# Patient Record
Sex: Male | Born: 1970 | Race: White | Hispanic: No | Smoking: Current every day smoker
Health system: Southern US, Community
[De-identification: ages and names within clinical notes are randomized; demographics above are authoritative.]

## PROBLEM LIST (undated history)

## (undated) DIAGNOSIS — I219 Acute myocardial infarction, unspecified: Secondary | ICD-10-CM

## (undated) DIAGNOSIS — K922 Gastrointestinal hemorrhage, unspecified: Secondary | ICD-10-CM

## (undated) DIAGNOSIS — N200 Calculus of kidney: Secondary | ICD-10-CM

## (undated) DIAGNOSIS — I2510 Atherosclerotic heart disease of native coronary artery without angina pectoris: Secondary | ICD-10-CM

## (undated) DIAGNOSIS — I10 Essential (primary) hypertension: Secondary | ICD-10-CM

## (undated) DIAGNOSIS — F1020 Alcohol dependence, uncomplicated: Secondary | ICD-10-CM

## (undated) DIAGNOSIS — E785 Hyperlipidemia, unspecified: Secondary | ICD-10-CM

## (undated) HISTORY — DX: Atherosclerotic heart disease of native coronary artery without angina pectoris: I25.10

## (undated) HISTORY — PX: OTHER SURGICAL HISTORY: SHX169

## (undated) HISTORY — PX: NEPHRECTOMY: SHX65

---

## 2014-02-13 ENCOUNTER — Other Ambulatory Visit: Payer: Self-pay | Admitting: Internal Medicine

## 2014-02-13 DIAGNOSIS — R413 Other amnesia: Secondary | ICD-10-CM

## 2014-02-13 DIAGNOSIS — I10 Essential (primary) hypertension: Secondary | ICD-10-CM

## 2014-05-01 ENCOUNTER — Emergency Department (HOSPITAL_COMMUNITY): Payer: Self-pay

## 2014-05-01 ENCOUNTER — Encounter (HOSPITAL_COMMUNITY): Payer: Self-pay | Admitting: *Deleted

## 2014-05-01 ENCOUNTER — Inpatient Hospital Stay (HOSPITAL_COMMUNITY)
Admission: EM | Admit: 2014-05-01 | Discharge: 2014-05-03 | DRG: 897 | Disposition: A | Discharge status: Home / Self Care | Payer: Self-pay | Attending: Internal Medicine | Admitting: Internal Medicine

## 2014-05-01 ENCOUNTER — Observation Stay (HOSPITAL_COMMUNITY): Payer: MEDICAID

## 2014-05-01 DIAGNOSIS — R079 Chest pain, unspecified: Secondary | ICD-10-CM

## 2014-05-01 DIAGNOSIS — R109 Unspecified abdominal pain: Secondary | ICD-10-CM

## 2014-05-01 DIAGNOSIS — R072 Precordial pain: Secondary | ICD-10-CM

## 2014-05-01 DIAGNOSIS — R945 Abnormal results of liver function studies: Secondary | ICD-10-CM

## 2014-05-01 DIAGNOSIS — Z8711 Personal history of peptic ulcer disease: Secondary | ICD-10-CM

## 2014-05-01 DIAGNOSIS — Z955 Presence of coronary angioplasty implant and graft: Secondary | ICD-10-CM

## 2014-05-01 DIAGNOSIS — F10239 Alcohol dependence with withdrawal, unspecified: Secondary | ICD-10-CM | POA: Diagnosis present

## 2014-05-01 DIAGNOSIS — F10231 Alcohol dependence with withdrawal delirium: Principal | ICD-10-CM | POA: Diagnosis present

## 2014-05-01 DIAGNOSIS — Z8249 Family history of ischemic heart disease and other diseases of the circulatory system: Secondary | ICD-10-CM

## 2014-05-01 DIAGNOSIS — I209 Angina pectoris, unspecified: Secondary | ICD-10-CM

## 2014-05-01 DIAGNOSIS — I959 Hypotension, unspecified: Secondary | ICD-10-CM | POA: Diagnosis present

## 2014-05-01 DIAGNOSIS — I10 Essential (primary) hypertension: Secondary | ICD-10-CM | POA: Diagnosis present

## 2014-05-01 DIAGNOSIS — F10939 Alcohol use, unspecified with withdrawal, unspecified: Secondary | ICD-10-CM | POA: Diagnosis present

## 2014-05-01 DIAGNOSIS — Z7982 Long term (current) use of aspirin: Secondary | ICD-10-CM

## 2014-05-01 DIAGNOSIS — I255 Ischemic cardiomyopathy: Secondary | ICD-10-CM | POA: Diagnosis present

## 2014-05-01 DIAGNOSIS — K760 Fatty (change of) liver, not elsewhere classified: Secondary | ICD-10-CM | POA: Diagnosis present

## 2014-05-01 DIAGNOSIS — R7989 Other specified abnormal findings of blood chemistry: Secondary | ICD-10-CM

## 2014-05-01 DIAGNOSIS — I2510 Atherosclerotic heart disease of native coronary artery without angina pectoris: Secondary | ICD-10-CM | POA: Diagnosis present

## 2014-05-01 DIAGNOSIS — D539 Nutritional anemia, unspecified: Secondary | ICD-10-CM | POA: Diagnosis present

## 2014-05-01 DIAGNOSIS — F10251 Alcohol dependence with alcohol-induced psychotic disorder with hallucinations: Secondary | ICD-10-CM | POA: Diagnosis present

## 2014-05-01 DIAGNOSIS — R0789 Other chest pain: Secondary | ICD-10-CM

## 2014-05-01 DIAGNOSIS — F17210 Nicotine dependence, cigarettes, uncomplicated: Secondary | ICD-10-CM | POA: Diagnosis present

## 2014-05-01 DIAGNOSIS — Z905 Acquired absence of kidney: Secondary | ICD-10-CM | POA: Diagnosis present

## 2014-05-01 DIAGNOSIS — F1020 Alcohol dependence, uncomplicated: Secondary | ICD-10-CM

## 2014-05-01 DIAGNOSIS — I252 Old myocardial infarction: Secondary | ICD-10-CM

## 2014-05-01 DIAGNOSIS — N19 Unspecified kidney failure: Secondary | ICD-10-CM | POA: Diagnosis present

## 2014-05-01 DIAGNOSIS — E785 Hyperlipidemia, unspecified: Secondary | ICD-10-CM | POA: Diagnosis present

## 2014-05-01 DIAGNOSIS — I369 Nonrheumatic tricuspid valve disorder, unspecified: Secondary | ICD-10-CM

## 2014-05-01 DIAGNOSIS — D696 Thrombocytopenia, unspecified: Secondary | ICD-10-CM | POA: Diagnosis present

## 2014-05-01 HISTORY — DX: Calculus of kidney: N20.0

## 2014-05-01 HISTORY — DX: Acute myocardial infarction, unspecified: I21.9

## 2014-05-01 HISTORY — DX: Atherosclerotic heart disease of native coronary artery without angina pectoris: I25.10

## 2014-05-01 HISTORY — DX: Hyperlipidemia, unspecified: E78.5

## 2014-05-01 HISTORY — DX: Gastrointestinal hemorrhage, unspecified: K92.2

## 2014-05-01 HISTORY — DX: Alcohol dependence, uncomplicated: F10.20

## 2014-05-01 HISTORY — DX: Essential (primary) hypertension: I10

## 2014-05-01 LAB — CBC
HCT: 37.2 % — ABNORMAL LOW (ref 39.0–52.0)
HEMATOCRIT: 37.7 % — AB (ref 39.0–52.0)
HEMOGLOBIN: 12.7 g/dL — AB (ref 13.0–17.0)
Hemoglobin: 12.7 g/dL — ABNORMAL LOW (ref 13.0–17.0)
MCH: 35 pg — ABNORMAL HIGH (ref 26.0–34.0)
MCH: 34.6 pg — AB (ref 26.0–34.0)
MCHC: 34.1 g/dL (ref 30.0–36.0)
MCHC: 33.7 g/dL (ref 30.0–36.0)
MCV: 102.5 fL — ABNORMAL HIGH (ref 78.0–100.0)
MCV: 102.7 fL — AB (ref 78.0–100.0)
PLATELETS: 119 10*3/uL — AB (ref 150–400)
Platelets: 120 10*3/uL — ABNORMAL LOW (ref 150–400)
RBC: 3.63 MIL/uL — AB (ref 4.22–5.81)
RBC: 3.67 MIL/uL — ABNORMAL LOW (ref 4.22–5.81)
RDW: 13.7 % (ref 11.5–15.5)
RDW: 13.9 % (ref 11.5–15.5)
WBC: 6.7 10*3/uL (ref 4.0–10.5)
WBC: 8 10*3/uL (ref 4.0–10.5)

## 2014-05-01 LAB — URINALYSIS, ROUTINE W REFLEX MICROSCOPIC
Bilirubin Urine: NEGATIVE
Glucose, UA: NEGATIVE mg/dL
Ketones, ur: NEGATIVE mg/dL
Leukocytes, UA: NEGATIVE
Nitrite: NEGATIVE
PH: 5.5 (ref 5.0–8.0)
Protein, ur: NEGATIVE mg/dL
Specific Gravity, Urine: 1.012 (ref 1.005–1.030)
UROBILINOGEN UA: 0.2 mg/dL (ref 0.0–1.0)

## 2014-05-01 LAB — HEPATIC FUNCTION PANEL
ALK PHOS: 67 U/L (ref 39–117)
ALT: 102 U/L — AB (ref 0–53)
AST: 105 U/L — AB (ref 0–37)
Albumin: 3.8 g/dL (ref 3.5–5.2)
Bilirubin, Direct: <0.2 mg/dL (ref 0.0–0.3)
TOTAL PROTEIN: 7.2 g/dL (ref 6.0–8.3)
Total Bilirubin: 0.6 mg/dL (ref 0.3–1.2)

## 2014-05-01 LAB — RAPID URINE DRUG SCREEN, HOSP PERFORMED
Amphetamines: NOT DETECTED
BENZODIAZEPINES: NOT DETECTED
Barbiturates: NOT DETECTED
Cocaine: NOT DETECTED
OPIATES: NOT DETECTED
Tetrahydrocannabinol: NOT DETECTED

## 2014-05-01 LAB — URINE MICROSCOPIC-ADD ON

## 2014-05-01 LAB — LIPASE, BLOOD: Lipase: 58 U/L (ref 11–59)

## 2014-05-01 LAB — GLUCOSE, CAPILLARY
GLUCOSE-CAPILLARY: 119 mg/dL — AB (ref 70–99)
Glucose-Capillary: 74 mg/dL (ref 70–99)
Glucose-Capillary: 74 mg/dL (ref 70–99)

## 2014-05-01 LAB — BASIC METABOLIC PANEL
ANION GAP: 16 — AB (ref 5–15)
BUN: 29 mg/dL — ABNORMAL HIGH (ref 6–23)
CO2: 24 meq/L (ref 19–32)
Calcium: 9.4 mg/dL (ref 8.4–10.5)
Chloride: 93 mEq/L — ABNORMAL LOW (ref 96–112)
Creatinine, Ser: 1.54 mg/dL — ABNORMAL HIGH (ref 0.50–1.35)
GFR calc Af Amer: 62 mL/min — ABNORMAL LOW (ref >90)
GFR calc non Af Amer: 53 mL/min — ABNORMAL LOW (ref >90)
Glucose, Bld: 97 mg/dL (ref 70–99)
POTASSIUM: 3.5 meq/L — AB (ref 3.7–5.3)
SODIUM: 133 meq/L — AB (ref 137–147)

## 2014-05-01 LAB — TROPONIN I
Troponin I: <0.3 ng/mL (ref <0.30)
Troponin I: <0.3 ng/mL (ref <0.30)
Troponin I: <0.3 ng/mL (ref <0.30)

## 2014-05-01 LAB — I-STAT TROPONIN, ED: TROPONIN I, POC: 0 ng/mL (ref 0.00–0.08)

## 2014-05-01 LAB — MAGNESIUM: Magnesium: 1.9 mg/dL (ref 1.5–2.5)

## 2014-05-01 LAB — CREATININE, SERUM
Creatinine, Ser: 1.37 mg/dL — ABNORMAL HIGH (ref 0.50–1.35)
GFR calc Af Amer: 71 mL/min — ABNORMAL LOW (ref >90)
GFR, EST NON AFRICAN AMERICAN: 61 mL/min — AB (ref >90)

## 2014-05-01 MED ORDER — CARVEDILOL 12.5 MG PO TABS
12.5000 mg | ORAL_TABLET | Freq: Two times a day (BID) | ORAL | Status: DC
  Administered 2014-05-01 – 2014-05-03 (×5): 12.5 mg via ORAL
  Filled 2014-05-01 (×9): qty 1

## 2014-05-01 MED ORDER — FOLIC ACID 1 MG PO TABS
1.0000 mg | ORAL_TABLET | Freq: Every day | ORAL | Status: DC
  Administered 2014-05-01 – 2014-05-03 (×3): 1 mg via ORAL
  Filled 2014-05-01 (×3): qty 1

## 2014-05-01 MED ORDER — NITROGLYCERIN 0.4 MG SL SUBL: 0.4000 mg | SUBLINGUAL_TABLET | SUBLINGUAL | Status: DC | PRN

## 2014-05-01 MED ORDER — ADULT MULTIVITAMIN W/MINERALS CH
1.0000 | ORAL_TABLET | Freq: Every day | ORAL | Status: DC
  Administered 2014-05-01 – 2014-05-03 (×3): 1 via ORAL
  Filled 2014-05-01 (×3): qty 1

## 2014-05-01 MED ORDER — CLOPIDOGREL BISULFATE 75 MG PO TABS
75.0000 mg | ORAL_TABLET | Freq: Every day | ORAL | Status: DC
  Administered 2014-05-01 – 2014-05-03 (×3): 75 mg via ORAL
  Filled 2014-05-01 (×5): qty 1

## 2014-05-01 MED ORDER — LORAZEPAM 2 MG/ML IJ SOLN: 0.0000 mg | Freq: Two times a day (BID) | INTRAMUSCULAR | Status: DC

## 2014-05-01 MED ORDER — ONDANSETRON HCL 4 MG/2ML IJ SOLN: 4.0000 mg | Freq: Four times a day (QID) | INTRAMUSCULAR | Status: DC | PRN

## 2014-05-01 MED ORDER — VITAMIN B-1 100 MG PO TABS
100.0000 mg | ORAL_TABLET | Freq: Every day | ORAL | Status: DC
  Administered 2014-05-01 – 2014-05-03 (×3): 100 mg via ORAL
  Filled 2014-05-01 (×3): qty 1

## 2014-05-01 MED ORDER — LORAZEPAM 2 MG/ML IJ SOLN
1.0000 mg | INTRAMUSCULAR | Status: DC
  Administered 2014-05-01 – 2014-05-02 (×5): 1 mg via INTRAVENOUS
  Filled 2014-05-01 (×4): qty 1

## 2014-05-01 MED ORDER — ESCITALOPRAM OXALATE 5 MG PO TABS
5.0000 mg | ORAL_TABLET | Freq: Every day | ORAL | Status: DC
  Administered 2014-05-02 (×2): 5 mg via ORAL
  Filled 2014-05-01 (×4): qty 1

## 2014-05-01 MED ORDER — ENOXAPARIN SODIUM 40 MG/0.4ML ~~LOC~~ SOLN
40.0000 mg | SUBCUTANEOUS | Status: DC
  Administered 2014-05-01 – 2014-05-03 (×2): 40 mg via SUBCUTANEOUS
  Filled 2014-05-01 (×3): qty 0.4

## 2014-05-01 MED ORDER — LORAZEPAM 2 MG/ML IJ SOLN
1.0000 mg | Freq: Four times a day (QID) | INTRAMUSCULAR | Status: DC | PRN
  Administered 2014-05-01: 1 mg via INTRAVENOUS

## 2014-05-01 MED ORDER — LORAZEPAM 2 MG/ML IJ SOLN
1.0000 mg | Freq: Four times a day (QID) | INTRAMUSCULAR | Status: DC
  Administered 2014-05-01: 1 mg via INTRAVENOUS
  Filled 2014-05-01 (×2): qty 1

## 2014-05-01 MED ORDER — POTASSIUM CHLORIDE 10 MEQ/100ML IV SOLN
10.0000 meq | INTRAVENOUS | Status: AC
  Filled 2014-05-01 (×7): qty 100

## 2014-05-01 MED ORDER — THIAMINE HCL 100 MG/ML IJ SOLN: 100.0000 mg | Freq: Every day | INTRAMUSCULAR | Status: DC

## 2014-05-01 MED ORDER — LORAZEPAM 2 MG/ML IJ SOLN: 1.0000 mg | INTRAMUSCULAR | Status: DC | PRN

## 2014-05-01 MED ORDER — ACETAMINOPHEN 650 MG RE SUPP: 650.0000 mg | Freq: Four times a day (QID) | RECTAL | Status: DC | PRN

## 2014-05-01 MED ORDER — ADULT MULTIVITAMIN W/MINERALS CH: 1.0000 | ORAL_TABLET | Freq: Every day | ORAL | Status: DC

## 2014-05-01 MED ORDER — ASPIRIN EC 325 MG PO TBEC
325.0000 mg | DELAYED_RELEASE_TABLET | Freq: Every day | ORAL | Status: DC
  Administered 2014-05-01: 325 mg via ORAL
  Filled 2014-05-01 (×3): qty 1

## 2014-05-01 MED ORDER — THIAMINE HCL 100 MG/ML IJ SOLN
100.0000 mg | Freq: Every day | INTRAMUSCULAR | Status: DC
  Filled 2014-05-01 (×3): qty 1

## 2014-05-01 MED ORDER — ONDANSETRON HCL 4 MG PO TABS: 4.0000 mg | ORAL_TABLET | Freq: Four times a day (QID) | ORAL | Status: DC | PRN

## 2014-05-01 MED ORDER — ATORVASTATIN CALCIUM 40 MG PO TABS
40.0000 mg | ORAL_TABLET | Freq: Every day | ORAL | Status: DC
  Administered 2014-05-01 – 2014-05-02 (×2): 40 mg via ORAL
  Filled 2014-05-01 (×3): qty 1

## 2014-05-01 MED ORDER — LORAZEPAM 2 MG/ML IJ SOLN
0.0000 mg | Freq: Two times a day (BID) | INTRAMUSCULAR | Status: DC
Start: 2014-05-03 — End: 2014-05-01

## 2014-05-01 MED ORDER — POTASSIUM CHLORIDE 10 MEQ/100ML IV SOLN
10.0000 meq | INTRAVENOUS | Status: AC
Start: 2014-05-01 — End: 2014-05-01
  Administered 2014-05-01 (×3): 10 meq via INTRAVENOUS
  Filled 2014-05-01: qty 100

## 2014-05-01 MED ORDER — DIPHENHYDRAMINE HCL 25 MG PO TABS
25.0000 mg | ORAL_TABLET | Freq: Every evening | ORAL | Status: DC | PRN
  Filled 2014-05-01: qty 1

## 2014-05-01 MED ORDER — SODIUM CHLORIDE 0.9 % IV SOLN
INTRAVENOUS | Status: DC
  Administered 2014-05-01 (×2): via INTRAVENOUS

## 2014-05-01 MED ORDER — LORAZEPAM 1 MG PO TABS: 1.0000 mg | ORAL_TABLET | Freq: Four times a day (QID) | ORAL | Status: DC | PRN

## 2014-05-01 MED ORDER — PANTOPRAZOLE SODIUM 40 MG PO TBEC
40.0000 mg | DELAYED_RELEASE_TABLET | Freq: Two times a day (BID) | ORAL | Status: DC
  Administered 2014-05-01 – 2014-05-03 (×5): 40 mg via ORAL
  Filled 2014-05-01 (×5): qty 1

## 2014-05-01 MED ORDER — FOLIC ACID 1 MG PO TABS: 1.0000 mg | ORAL_TABLET | Freq: Every day | ORAL | Status: DC

## 2014-05-01 MED ORDER — LORAZEPAM 2 MG/ML IJ SOLN
0.0000 mg | Freq: Four times a day (QID) | INTRAMUSCULAR | Status: DC
Start: 2014-05-01 — End: 2014-05-03
  Filled 2014-05-01: qty 1

## 2014-05-01 MED ORDER — SODIUM CHLORIDE 0.9 % IJ SOLN
3.0000 mL | Freq: Two times a day (BID) | INTRAMUSCULAR | Status: DC
  Administered 2014-05-02 – 2014-05-03 (×3): 3 mL via INTRAVENOUS

## 2014-05-01 MED ORDER — LORAZEPAM 1 MG PO TABS
1.0000 mg | ORAL_TABLET | ORAL | Status: DC | PRN
  Administered 2014-05-02 (×3): 2 mg via ORAL
  Filled 2014-05-01 (×3): qty 2

## 2014-05-01 MED ORDER — ACETAMINOPHEN 325 MG PO TABS: 650.0000 mg | ORAL_TABLET | Freq: Four times a day (QID) | ORAL | Status: DC | PRN

## 2014-05-01 MED ORDER — MORPHINE SULFATE 2 MG/ML IJ SOLN: 1.0000 mg | INTRAMUSCULAR | Status: DC | PRN

## 2014-05-01 MED ORDER — VITAMIN B-1 100 MG PO TABS: 100.0000 mg | ORAL_TABLET | Freq: Every day | ORAL | Status: DC

## 2014-05-01 MED ORDER — LORAZEPAM 2 MG/ML IJ SOLN
0.0000 mg | Freq: Four times a day (QID) | INTRAMUSCULAR | Status: DC
Start: 2014-05-01 — End: 2014-05-01

## 2014-05-01 MED ORDER — LISINOPRIL 20 MG PO TABS
20.0000 mg | ORAL_TABLET | Freq: Every day | ORAL | Status: DC
  Administered 2014-05-01 – 2014-05-03 (×3): 20 mg via ORAL
  Filled 2014-05-01 (×3): qty 1

## 2014-05-01 NOTE — Consult Note (Signed)
CARDIOLOGY CONSULT NOTE  Patient ID: Bradley Compton MRN: [id number] DOB/AGE: 44-13-1972 44 y.o.  Admit date: 05/01/2014 Primary Physician EDWARDS, Kinnie ScalesMICHELLE P, NP Primary Cardiologist    Dr. Bary CastillaKalil,  [place] Chief Complaint  Chest pain  HPI:  The patient presents with chest pain.  He reports a history of CAD with [place].  These are reported below.  He has had a cath he reports about 3 months ago in [facility] for chest [facility].  He was told that his stents were patent.  Since that time he reports that he has done well with occasional chest pain.  However, over the past week or so he has had recurrence of the pain that he had 3 months ago.  He never had angina with his MI in 2012 and is not having symptoms similar to this presentation.  The discomfort that brought him in this time was in his left upper chest.  There is a pressure and also a sharp pain that can be 6/10 in intensity.  This AM it was worse.  Before he could take a NTG at home he had vomiting.  However, the EMS did give him some NTG.  He is not sure whether this was better or not.  He thought that this helped a little.  The pain eventually went away.  EKG in the ED demonstrated T wave inversion.  However, there was no old EKG for comparison.   He does not report radiation of the pain.  He is not having any acute SOB, palpitations, presyncope or syncope.  He has had no recent SOB, PND, or orthopnea.    Past Medical History  Diagnosis Date  . Myocardial infarction     2012  . Coronary artery disease     Xience V 2.5 x 15 mid LAD x 2, RCA Xience V 3.0 x 15 x 1, Xience V 3. x 23 mm x 1, 2012. [facility]  . Hypertension   . Hyperlipidemia   . GI bleed     Past Surgical History  Procedure Laterality Date  . Chest tube placement      Allergies  Allergen Reactions  . Other Itching and Rash    Evergreen trees   Prescriptions prior to admission  Medication Sig Dispense Refill Last Dose    . atorvastatin (LIPITOR) 40 MG tablet Take 40 mg by mouth at bedtime as needed.   04/30/2014 at Unknown time  . carvedilol (COREG) 12.5 MG tablet Take 12.5 mg by mouth 2 (two) times daily with a meal.   04/30/2014 at 2330  . clopidogrel (PLAVIX) 75 MG tablet Take 75 mg by mouth daily.   04/30/2014 at Unknown time  . diphenhydrAMINE (BENADRYL) 25 MG tablet Take 25 mg by mouth at bedtime as needed for sleep.   04/30/2014 at Unknown time  . escitalopram (LEXAPRO) 10 MG tablet Take 5 mg by mouth at bedtime.   04/30/2014 at Unknown time  . lisinopril (PRINIVIL,ZESTRIL) 20 MG tablet Take 20 mg by mouth daily.   04/30/2014 at Unknown time  . Melatonin-Pyridoxine (MELATIN PO) Take 1 tablet by mouth at bedtime as needed (sleep).   Past Week at Unknown time  . Multiple Vitamin (MULTIVITAMIN WITH MINERALS) TABS tablet Take 1 tablet by mouth daily.   04/30/2014 at Unknown time   Family History  Problem Relation Age of Onset  . Hypertension Mother   . CAD Maternal Uncle     History  Social History  . Marital Status: Unknown    Spouse Name: N/A    Number of Children: N/A  . Years of Education: N/A   Occupational History  . Not on file.   Social History Main Topics  . Smoking status: Current Every Day Smoker  . Smokeless tobacco: Not on file  . Alcohol Use: 0.0 oz/week    0 Not specified per week     Comment: Vodka everyday.  . Drug Use: Not on file  . Sexual Activity: Not on file   Other Topics Concern  . Not on file   Social History Narrative     ROS:    As stated in the HPI and negative for all other systems.  Physical Exam: Blood pressure 104/62, pulse 60, temperature 97.8 F (36.6 C), temperature source Oral, resp. rate 20, height 5\' 9"  (1.753 m), weight 120 lb (54.432 kg), SpO2 99 %.  GENERAL:  Well appearing, thin and no distress HEENT:  Pupils equal round and reactive, fundi not visualized, oral mucosa unremarkable NECK:  No jugular venous distention, waveform within normal  limits, carotid upstroke brisk and symmetric, no bruits, no thyromegaly LYMPHATICS:  No cervical, inguinal adenopathy LUNGS:  Clear to auscultation bilaterally BACK:  No CVA tenderness CHEST:  Unremarkable HEART:  PMI not displaced or sustained,S1 and S2 within normal limits, no S3, no S4, no clicks, no rubs, no murmurs ABD:  Flat, positive bowel sounds normal in frequency in pitch, no bruits, no rebound, no guarding, no midline pulsatile mass, no hepatomegaly, no splenomegaly EXT:  2 plus pulses throughout, no edema, no cyanosis no clubbing SKIN:  No rashes no nodules NEURO:  Cranial nerves II through XII grossly intact, motor grossly intact throughout PSYCH:  Cognitively intact, oriented to person place and time  Labs: Lab Results  Component Value Date   BUN 29* 05/01/2014   Lab Results  Component Value Date   CREATININE 1.37* 05/01/2014   Lab Results  Component Value Date   NA 133* 05/01/2014   K 3.5* 05/01/2014   CL 93* 05/01/2014   CO2 24 05/01/2014   Lab Results  Component Value Date   TROPONINI <0.30 05/01/2014   Lab Results  Component Value Date   WBC 8.0 05/01/2014   HGB 12.7* 05/01/2014   HCT 37.7* 05/01/2014   MCV 102.7* 05/01/2014   PLT 120* 05/01/2014   No results found for: CHOL, HDL, LDLCALC, LDLDIRECT, TRIG, CHOLHDL Lab Results  Component Value Date   ALT 102* 05/01/2014   AST 105* 05/01/2014   ALKPHOS 67 05/01/2014   BILITOT 0.6 05/01/2014     Radiology:   CXR: Normal heart size and mediastinal contours. There is extensive coronary stenting in the left circulation. There is no edema, consolidation, effusion, or pneumothorax. Minimal scarring or atelectasis in the left mid lung.  EKG: NSR, rate 55, axis WNL, intervals WNL, anterior T wave changes.  Consider ischemia.  However, no old EKGs to compare.  05/01/2014  ASSESSMENT AND PLAN:   CHEST PAIN:  The pain is atypical.  At this point I think the pretest probability that his pain is cardiac is  low.  He had this same pain at the time of a cath recently.  We will obtain the old EKG to compare and look at the results of the recent cath.  If the EKG is unchanged and the stents were patent as he reports, without further objective evidence of ischemia, no further work up will be indicated from a  cardiac standpoint.  Rather I would suggest a GI work up.  I would continue current therapy  TOBACCO:  He has no interest in quitting smoking.  ETOH:  He reports that he has had treatment for alcoholism but has no interest in quitting at this time.   SignedRollene Rotunda: Stori Royse 05/01/2014, 9:36 AM

## 2014-05-01 NOTE — Progress Notes (Addendum)
Previous medical record reviewed with Dr. Antoine PocheHochrein, last cath 09/19/2013 shows previus stents patent with good flow, no new obstructive disease. Patient placed on medical therapy. EKG Aug 2012 NSR with diffuse TWI, poor R wave progression in anterior lead. When compare to today's EKG, today EKG shows t wave is upright in inferior lead whereas previous TW was inverted in inferior lead. Today's EKG continue to show anterior lateral TWI.   Ramond DialSigned, Hao Meng PA Pager: [phone]   As per Dr. Jenene SlickerHochrein's consult note - we will sign off.  Unlikely to be a cardiac problem.   As usual, we will be available to re-consult as necessary.  Please do not hesitate to contact us with ?s/  HARDING, Piedad ClimesAVID W, M.D., M.S. Interventional Cardiologist   Pager # [phone]

## 2014-05-01 NOTE — ED Provider Notes (Signed)
CSN: [id number]     Arrival date & time 05/01/14  0207 History   First MD Initiated Contact with Patient 05/01/14 0304     Chief Complaint  Patient presents with  . Chest Pain     (Consider location/radiation/quality/duration/timing/severity/associated sxs/prior Treatment) HPI Comments: Pt with hc of CAD s/p PCI (2012) at [facility] comes in with cc of chest pain. Pt reports that he started having chest pain at 1 am. Pt has been having off and on chest pain for the past several days. Chest pain described as severe sharp pain, and heaviness.   Aggravating or relieving factors: none Associated symptoms: Nausea, clammy skin  Provocative cardiac workup recently: none recently.  Patient is a 43 y.o. male presenting with chest pain. The history is provided by the patient.  Chest Pain Associated symptoms: no abdominal pain, no cough and no shortness of breath     No past medical history on file. No past surgical history on file. No family history on file. History  Substance Use Topics  . Smoking status: Not on file  . Smokeless tobacco: Not on file  . Alcohol Use: Not on file    Review of Systems  Constitutional: Negative for activity change and appetite change.  Respiratory: Negative for cough and shortness of breath.   Cardiovascular: Positive for chest pain.  Gastrointestinal: Negative for abdominal pain.  Genitourinary: Negative for dysuria.  All other systems reviewed and are negative.     Allergies  Other  Home Medications   Prior to Admission medications   Medication Sig Start Date End Date Taking? Authorizing Provider  atorvastatin (LIPITOR) 40 MG tablet Take 40 mg by mouth at bedtime as needed.   Yes Historical Provider, MD  carvedilol (COREG) 12.5 MG tablet Take 12.5 mg by mouth 2 (two) times daily with a meal.   Yes Historical Provider, MD  clopidogrel (PLAVIX) 75 MG tablet Take 75 mg by mouth daily.   Yes Historical Provider, MD  diphenhydrAMINE  (BENADRYL) 25 MG tablet Take 25 mg by mouth at bedtime as needed for sleep.   Yes Historical Provider, MD  escitalopram (LEXAPRO) 10 MG tablet Take 5 mg by mouth at bedtime.   Yes Historical Provider, MD  lisinopril (PRINIVIL,ZESTRIL) 20 MG tablet Take 20 mg by mouth daily.   Yes Historical Provider, MD  Melatonin-Pyridoxine (MELATIN PO) Take 1 tablet by mouth at bedtime as needed (sleep).   Yes Historical Provider, MD  Multiple Vitamin (MULTIVITAMIN WITH MINERALS) TABS tablet Take 1 tablet by mouth daily.   Yes Historical Provider, MD   BP 106/58 mmHg  Pulse 63  Temp(Src) 97.5 F (36.4 C) (Oral)  Resp 23  Ht 5\' 9"  (1.753 m)  Wt 120 lb (54.432 kg)  BMI 17.71 kg/m2  SpO2 99% Physical Exam  Constitutional: He is oriented to person, place, and time. He appears well-developed.  HENT:  Head: Normocephalic and atraumatic.  Eyes: Conjunctivae and EOM are normal. Pupils are equal, round, and reactive to light.  Neck: Normal range of motion. Neck supple.  Cardiovascular: Normal rate and regular rhythm.   Pulmonary/Chest: Effort normal and breath sounds normal.  Abdominal: Soft. Bowel sounds are normal. He exhibits no distension. There is no tenderness. There is no rebound and no guarding.  Neurological: He is alert and oriented to person, place, and time.  Skin: Skin is warm.  Nursing note and vitals reviewed.   ED Course  Procedures (including critical care time) Labs Review Labs Reviewed  CBC -  Abnormal; Notable for the following:    RBC 3.63 (*)    Hemoglobin 12.7 (*)    HCT 37.2 (*)    MCV 102.5 (*)    MCH 35.0 (*)    Platelets 119 (*)    All other components within normal limits  BASIC METABOLIC PANEL - Abnormal; Notable for the following:    Sodium 133 (*)    Potassium 3.5 (*)    Chloride 93 (*)    BUN 29 (*)    Creatinine, Ser 1.54 (*)    GFR calc non Af Amer 53 (*)    GFR calc Af Amer 62 (*)    Anion gap 16 (*)    All other components within normal limits  I-STAT  TROPOININ, ED    Imaging Review Dg Chest 2 View  05/01/2014   CLINICAL DATA:  Left-sided chest pain with vomiting.  EXAM: CHEST  2 VIEW  COMPARISON:  None.  FINDINGS: Normal heart size and mediastinal contours. There is extensive coronary stenting in the left circulation. There is no edema, consolidation, effusion, or pneumothorax. Minimal scarring or atelectasis in the left mid lung.  IMPRESSION: 1. No active cardiopulmonary disease. 2. Extensive LAD stenting.   Electronically Signed   By: Tiburcio PeaJonathan  Watts M.D.   On: 05/01/2014 03:04     EKG Interpretation   Date/Time:  Thursday May 01 2014 02:22:23 EST Ventricular Rate:  66 PR Interval:  175 QRS Duration: 91 QT Interval:  438 QTC Calculation: 459 R Axis:   71 Text Interpretation:  Sinus rhythm Probable left ventricular hypertrophy  Anterolateral infarct, age indeterminate Confirmed by Rhunette CroftNANAVATI, MD, Janey GentaANKIT  [id number]) on 05/01/2014 3:41:56 AM      MDM   Final diagnoses:  Chest pain   Differential diagnosis includes: ACS syndrome CHF exacerbation Valvular disorder Myocarditis Pericarditis Pericardial effusion Pneumonia Pleural effusion Pulmonary edema PE Anemia Musculoskeletal pain Dissection  Pt comes in with cc of chest pain. Chest pain is midsternal, sharp. Has hx of CAD. Currently chest pain free. Pt received nitro and full dose ASA per EMS. Given the significant history - will admit for a rule out.   Derwood KaplanAnkit Nayara Taplin, MD 05/03/14 [id number]

## 2014-05-01 NOTE — ED Notes (Signed)
Per EMS, pt in with chest pain. Previous stents placed x 4. Pt given 1 SL nitro and pressure dropped to 50 systolic, given 500 mL bolus. Took 325 asa PTA.

## 2014-05-01 NOTE — Progress Notes (Signed)
UR completed 

## 2014-05-01 NOTE — Progress Notes (Signed)
Echocardiogram 2D Echocardiogram has been performed.  Dorothey BasemanReel, Javonta Gronau M 05/01/2014, 3:26 PM

## 2014-05-01 NOTE — H&P (Signed)
[facility] History and Physical  Bradley SilviusBryan Glasper ZOX:[id number] DOB: 03/15/1971 DOA: 05/01/2014  Referring physician: ER physician. PCP: No primary care provider on file.   Chief Complaint: Chest pain.  HPI: Bradley Compton is a 43 y.o. male with history of CAD status post stenting in 2011 at [facility], hypertension hyperlipidemia presents to the ER because of chest pain. Patient started experiencing chest pain around 1 AM which was left-sided stabbing in nature and nonradiating associated with nausea vomiting. EMS was called immediately and patient was brought to the ER. En route patient was given nitroglycerin and as per the ER physician patient blood pressure had dropped and was given 1 L normal saline bolus. In the ER patient was chest pain-free. Cardiac markers were negative EKG was showing ST changes in the lateral leads. Patient has been admitted for further workup of ACS. Patient said he has been some abdominal discomfort over the last 2 days which is nonspecific and abdomen appears benign on exam. LFTs and lipase are pending. Patient states he still smokes cigarettes and drinks coffee every day. Patient has had previous history of GI bleed and was found to have gastric ulcer. Patient only has one kidney and has had undergone previous nephrectomy.  Review of Systems: As presented in the history of presenting illness, rest negative.  Past Medical History  Diagnosis Date  . Myocardial infarction   . Coronary artery disease   . Hypertension   . Hyperlipidemia   . GI bleed    Past Surgical History  Procedure Laterality Date  . Cardiac catheterization    . Chest tube placement     Social History:  reports that he has been smoking.  He does not have any smokeless tobacco history on file. He reports that he drinks alcohol. His drug history is not on file. Where does patient live home. Can patient participate in ADLs? Yes.  Allergies  Allergen  Reactions  . Other Itching and Rash    Evergreen trees    Family History:  Family History  Problem Relation Age of Onset  . Hypertension Mother   . CAD Maternal Uncle       Prior to Admission medications   Medication Sig Start Date End Date Taking? Authorizing Provider  atorvastatin (LIPITOR) 40 MG tablet Take 40 mg by mouth at bedtime as needed.   Yes Historical Provider, MD  carvedilol (COREG) 12.5 MG tablet Take 12.5 mg by mouth 2 (two) times daily with a meal.   Yes Historical Provider, MD  clopidogrel (PLAVIX) 75 MG tablet Take 75 mg by mouth daily.   Yes Historical Provider, MD  diphenhydrAMINE (BENADRYL) 25 MG tablet Take 25 mg by mouth at bedtime as needed for sleep.   Yes Historical Provider, MD  escitalopram (LEXAPRO) 10 MG tablet Take 5 mg by mouth at bedtime.   Yes Historical Provider, MD  lisinopril (PRINIVIL,ZESTRIL) 20 MG tablet Take 20 mg by mouth daily.   Yes Historical Provider, MD  Melatonin-Pyridoxine (MELATIN PO) Take 1 tablet by mouth at bedtime as needed (sleep).   Yes Historical Provider, MD  Multiple Vitamin (MULTIVITAMIN WITH MINERALS) TABS tablet Take 1 tablet by mouth daily.   Yes Historical Provider, MD    Physical Exam: Filed Vitals:   05/01/14 0445 05/01/14 0500 05/01/14 0530 05/01/14 0609  BP: 109/60 101/65 108/62 104/62  Pulse: 64 61 64 60  Temp:    97.8 F (36.6 C)  TempSrc:    Oral  Resp: 17 15  21 20  Height:      Weight:      SpO2: 98% 99% 99% 99%     General: Moderately built and nourished.  Eyes: Anicteric no pallor.  ENT: No discharge from the ears eyes nose and mouth.  Neck: No mass felt.  Cardiovascular: S1 and S2 heard.  Respiratory: No rhonchi or crepitations.  Abdomen: Soft nontender bowel sounds present.  Skin: No rash.  Musculoskeletal: No edema.  Psychiatric: Appears normal.  Neurologic: Alert awake oriented to time place and person. Moves all extremities.  Labs on Admission:  Basic Metabolic Panel:  Recent  Labs Lab 05/01/14 0228  NA 133*  K 3.5*  CL 93*  CO2 24  GLUCOSE 97  BUN 29*  CREATININE 1.54*  CALCIUM 9.4   Liver Function Tests: No results for input(s): AST, ALT, ALKPHOS, BILITOT, PROT, ALBUMIN in the last 168 hours.  Recent Labs Lab 05/01/14 0228  LIPASE 58   No results for input(s): AMMONIA in the last 168 hours. CBC:  Recent Labs Lab 05/01/14 0228  WBC 6.7  HGB 12.7*  HCT 37.2*  MCV 102.5*  PLT 119*   Cardiac Enzymes: No results for input(s): CKTOTAL, CKMB, CKMBINDEX, TROPONINI in the last 168 hours.  BNP (last 3 results) No results for input(s): PROBNP in the last 8760 hours. CBG: No results for input(s): GLUCAP in the last 168 hours.  Radiological Exams on Admission: Dg Chest 2 View  05/01/2014   CLINICAL DATA:  Left-sided chest pain with vomiting.  EXAM: CHEST  2 VIEW  COMPARISON:  None.  FINDINGS: Normal heart size and mediastinal contours. There is extensive coronary stenting in the left circulation. There is no edema, consolidation, effusion, or pneumothorax. Minimal scarring or atelectasis in the left mid lung.  IMPRESSION: 1. No active cardiopulmonary disease. 2. Extensive LAD stenting.   Electronically Signed   By: Tiburcio PeaJonathan  Watts M.D.   On: 05/01/2014 03:04    EKG: Independently reviewed. Normal sinus rhythm with ST-T changes in the lateral leads. No old EKG to compare. Will discuss EKG with cardiologist.  Assessment/Plan Principal Problem:   Chest pain Active Problems:   Essential hypertension   Hyperlipidemia   Macrocytic anemia   Thrombocytopenia   1. Chest pain with history of CAD concerning for ACS - chest pain appears atypical but given history of CAD status post stenting at this time we will cycle cardiac markers.Keep patient nothing by mouth in anticipation of possible cardiac procedure. Patient is on Plavix and aspirin. Check 2-D echo. Since patient was having vague abdominal symptoms LFTs and lipase are pending. 2. Renal failure  with history of left-sided nephrectomy - baseline creatinine is not known. Follow metabolic panel closely. At this time gently hydrating. Patient was initially mildly hypotensive as per ER physician after patient received nitroglycerin sublingual. UA is pending. Patient is on lisinopril which may need to be held if patient's creatinine further worsens. 3. Hypertension - continue present medication. See #2. 4. Hyperlipidemia - continue statins. 5. Macrocytic anemia with thrombocytopenia - probably related to alcoholism. Patient did have previous history of GI bleed with gastric ulcer. Follow CBC. 6. Tobacco abuse - tobacco cessation counseling requested. 7. Since patient drinks alcohol everyday I have placed patient on alcohol withdrawal protocol.    Code Status: Full code.  Family Communication: Patient's wife at bedside.  Disposition Plan: Admit for observation.    KAKRAKANDY,ARSHAD N. [facility] Pager [phone].  If 7PM-7AM, please contact night-coverage [email or web address] Password [email or web address] 05/01/2014, 6:57 AM

## 2014-05-01 NOTE — Progress Notes (Signed)
Patient admitted after midnight. Chart reviewed. Patient examined. No chest pain currently.  His cardiologist is Dr. Heron NayKhalil.  Primary care provider is [facility].  Reports having had a cardiac catheter[facility] at [facility] within the past year which was reportedly "okay ". Admits to sometimes forgetting some of his medications but for the most part is compliant with medications. He smokes one half to a whole pack of cigarettes a day. Denies drug use. Drinks about a pint to a fifth of liquor per day, but has not been drinking as much as usual over the past few days because he was "not feeling well".  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 Lipase, LFTs pending.   will repeat EKG, cycle cardiac markers, consult cardiology, [facility]. Patient also appears to be slightly tremulous and has a history of heavy drinking. Continue CIWA protocol and will add scheduled Ativan.  Bradley Curborinna Didi Ganaway, MD [facility] [email or web address]

## 2014-05-02 DIAGNOSIS — I255 Ischemic cardiomyopathy: Secondary | ICD-10-CM | POA: Diagnosis present

## 2014-05-02 DIAGNOSIS — F10239 Alcohol dependence with withdrawal, unspecified: Secondary | ICD-10-CM | POA: Diagnosis present

## 2014-05-02 DIAGNOSIS — D696 Thrombocytopenia, unspecified: Secondary | ICD-10-CM

## 2014-05-02 DIAGNOSIS — F10939 Alcohol use, unspecified with withdrawal, unspecified: Secondary | ICD-10-CM | POA: Diagnosis present

## 2014-05-02 DIAGNOSIS — R7989 Other specified abnormal findings of blood chemistry: Secondary | ICD-10-CM

## 2014-05-02 DIAGNOSIS — F10231 Alcohol dependence with withdrawal delirium: Principal | ICD-10-CM

## 2014-05-02 DIAGNOSIS — R945 Abnormal results of liver function studies: Secondary | ICD-10-CM

## 2014-05-02 LAB — BASIC METABOLIC PANEL
Anion gap: 9 (ref 5–15)
BUN: 19 mg/dL (ref 6–23)
CALCIUM: 9.2 mg/dL (ref 8.4–10.5)
CO2: 24 meq/L (ref 19–32)
CREATININE: 1.1 mg/dL (ref 0.50–1.35)
Chloride: 104 mEq/L (ref 96–112)
GFR calc Af Amer: >90 mL/min (ref >90)
GFR calc non Af Amer: 80 mL/min — ABNORMAL LOW (ref >90)
Glucose, Bld: 88 mg/dL (ref 70–99)
Potassium: 4.6 mEq/L (ref 3.7–5.3)
SODIUM: 137 meq/L (ref 137–147)

## 2014-05-02 LAB — CBC
HEMATOCRIT: 36.9 % — AB (ref 39.0–52.0)
Hemoglobin: 12 g/dL — ABNORMAL LOW (ref 13.0–17.0)
MCH: 33.4 pg (ref 26.0–34.0)
MCHC: 32.5 g/dL (ref 30.0–36.0)
MCV: 102.8 fL — AB (ref 78.0–100.0)
Platelets: 115 10*3/uL — ABNORMAL LOW (ref 150–400)
RBC: 3.59 MIL/uL — ABNORMAL LOW (ref 4.22–5.81)
RDW: 13.8 % (ref 11.5–15.5)
WBC: 6.5 10*3/uL (ref 4.0–10.5)

## 2014-05-02 LAB — GLUCOSE, CAPILLARY
GLUCOSE-CAPILLARY: 103 mg/dL — AB (ref 70–99)
Glucose-Capillary: 76 mg/dL (ref 70–99)

## 2014-05-02 LAB — VITAMIN B12: VITAMIN B 12: 1208 pg/mL — AB (ref 211–911)

## 2014-05-02 LAB — FOLATE RBC: RBC FOLATE: 720 ng/mL — AB (ref >280)

## 2014-05-02 NOTE — Progress Notes (Signed)
[facility] PROGRESS NOTE  Gilford SilviusBryan Slavick ZOX:[id number] DOB: Jun 25, 1970 DOA: 05/01/2014 PCP: Grayce SessionsEDWARDS, MICHELLE P, NP  Assessment/Plan:  Principal Problem:   Chest pain:  MI ruled out. Cardiac cath from 5/15 reviewed.  Had previous LAD and right coronary stents which looked fine. EF on cath was 40%. Apical hypokinesis present.  Continue aspirin, Plavix, statin, ACE inhibitor. Continue empiric Protonix. Patient in florid DTs. Will discontinue telemetry as it seems to be making patient more agitated.  Cardiology without plans for further workup. Transaminases slightly high. Alkaline phosphatase okay. Lipase okay. Active Problems:   Essential hypertension:     Hyperlipidemia   Macrocytic anemia: B12 ok. Folate pending   Thrombocytopenia, alcohol related   Cardiomyopathy, ischemic:  No signs of CHF continue ACE inhibitor:  EF on echo here essentially the same as on catheterization earlier this year.   Alcohol withdrawal:  Still very tremulous and having hallucinations despite scheduled Ativan in addition to see where protocol.Marland Kitchen. He is oriented and cooperative. Not stable for discharge.  Continue Increased LFTs secondary to alcohol/fatty liver.  Code Status:  full Family Communication:   Disposition Plan:  Needs to stay inpatient until more stable from a withdrawal standpoint  Consultants:  [facility]  Procedures:     Antibiotics:    HPI/Subjective: No further chest pain. Feels anxious. Seeing things "floating around the room and in the hallway".  Objective: Filed Vitals:   05/02/14 0540  BP: 104/60  Pulse: 62  Temp: 97.3 F (36.3 C)  Resp: 22   No intake or output data in the 24 hours ending 05/02/14 1015 Filed Weights   05/01/14 0226  Weight: 54.432 kg (120 lb)    Exam:   General:  Asleep. Arousable. Distracted. Cooperative. Oriented. tremulous  Cardiovascular: RRR without MGR  Respiratory: CTA without WRR  Abdomen: S, NT, ND  Ext: no CCE  Basic  Metabolic Panel:  Recent Labs Lab 05/01/14 0228 05/01/14 0742 05/01/14 1324 05/02/14 0505  NA 133*  --   --  137  K 3.5*  --   --  4.6  CL 93*  --   --  104  CO2 24  --   --  24  GLUCOSE 97  --   --  88  BUN 29*  --   --  19  CREATININE 1.54* 1.37*  --  1.10  CALCIUM 9.4  --   --  9.2  MG  --   --  1.9  --    Liver Function Tests:  Recent Labs Lab 05/01/14 0742  AST 105*  ALT 102*  ALKPHOS 67  BILITOT 0.6  PROT 7.2  ALBUMIN 3.8    Recent Labs Lab 05/01/14 0228  LIPASE 58   No results for input(s): AMMONIA in the last 168 hours. CBC:  Recent Labs Lab 05/01/14 0228 05/01/14 0742 05/02/14 0505  WBC 6.7 8.0 6.5  HGB 12.7* 12.7* 12.0*  HCT 37.2* 37.7* 36.9*  MCV 102.5* 102.7* 102.8*  PLT 119* 120* 115*   Cardiac Enzymes:  Recent Labs Lab 05/01/14 0742 05/01/14 1324 05/01/14 2109  TROPONINI <0.30 <0.30 <0.30   BNP (last 3 results) No results for input(s): PROBNP in the last 8760 hours. CBG:  Recent Labs Lab 05/01/14 1247 05/01/14 1811 05/01/14 2340 05/02/14 0604  GLUCAP 74 74 119* 76    No results found for this or any previous visit (from the past 240 hour(s)).   Studies: Dg Chest 2 View  05/01/2014   CLINICAL DATA:  Left-sided  chest pain with vomiting.  EXAM: CHEST  2 VIEW  COMPARISON:  None.  FINDINGS: Normal heart size and mediastinal contours. There is extensive coronary stenting in the left circulation. There is no edema, consolidation, effusion, or pneumothorax. Minimal scarring or atelectasis in the left mid lung.  IMPRESSION: 1. No active cardiopulmonary disease. 2. Extensive LAD stenting.   Electronically Signed   By: Tiburcio PeaJonathan  Watts M.D.   On: 05/01/2014 03:04   [place] Abdomen Complete  05/01/2014   CLINICAL DATA:  Abdominal pain R10.9 (ICD-10-CM) LFT elevation R79.89 (ICD-10-CM) Alcoholism F10.20 (ICD-10-CM)  EXAM: ULTRASOUND ABDOMEN COMPLETE  COMPARISON:  None.  FINDINGS: Gallbladder: Contracted gallbladder. No stones or sludge  identified. Gallbladder wall measures 2 mm, normal. No sonographic Murphy sign.  Common bile duct: Diameter: 9 mm, dilated for age. No visible common bile duct stone. The proximal and distal portions of the common bile duct appear normal diameter, suggesting a choledochal cyst accounting for over the prominence.  Liver: Echogenic, compatible with hepatosteatosis. No intrahepatic biliary ductal dilation.  IVC: No abnormality visualized.  Pancreas: Visualized portion unremarkable.  Spleen: Size and appearance within normal limits.  Right Kidney: Length: 10.7 cm. Echogenicity within normal limits. No mass or hydronephrosis visualized.  Left Kidney:  Nephrectomy.  Abdominal aorta: No aneurysm visualized.  Other findings: None.  IMPRESSION: 1. Dilation of the middle portion of the common bile duct in 9 mm. 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 MRCP in follow-up could confirm. Non-emergent MRI should be deferred until patient has been discharged for the acute illness, and can optimally cooperate with positioning and breath-holding instructions. 2. LEFT nephrectomy. 3. Echogenic liver compatible with hepatosteatosis.   Electronically Signed   By: Andreas NewportGeoffrey  Lamke M.D.   On: 05/01/2014 20:50    Scheduled Meds: . aspirin EC  325 mg Oral Daily  . atorvastatin  40 mg Oral q1800  . carvedilol  12.5 mg Oral BID WC  . clopidogrel  75 mg Oral Q breakfast  . enoxaparin (LOVENOX) injection  40 mg Subcutaneous Q24H  . escitalopram  5 mg Oral QHS  . folic acid  1 mg Oral Daily  . lisinopril  20 mg Oral Daily  . LORazepam  0-4 mg Intravenous Q6H   Followed by  . [START ON 05/03/2014] LORazepam  0-4 mg Intravenous Q12H  . LORazepam  1 mg Intravenous Q4H  . multivitamin with minerals  1 tablet Oral Daily  . pantoprazole  40 mg Oral BID  . sodium chloride  3 mL Intravenous Q12H  . thiamine  100 mg Oral  Daily   Or  . thiamine  100 mg Intravenous Daily   Continuous Infusions:   Time spent: 35 minutes  Raegan Winders L  [facility]  [email or web address], password [email or web address] 05/02/2014, 10:15 AM  LOS: 1 day

## 2014-05-02 NOTE — Progress Notes (Signed)
UR completed 

## 2014-05-02 NOTE — Progress Notes (Signed)
Pt left building presumably smoke.  Informed RN before leaving, advised not to leave.  Was gone approx 20 mins and returned to room on own accord.  Will con't plan of care.

## 2014-05-02 NOTE — Plan of Care (Signed)
Problem: Phase I Progression Outcomes Goal: Aspirin unless contraindicated Outcome: Not Applicable Date Met:  50/51/07 Pt refuses ASA d/t plavix

## 2014-05-03 LAB — GLUCOSE, CAPILLARY: GLUCOSE-CAPILLARY: 103 mg/dL — AB (ref 70–99)

## 2014-05-03 MED ORDER — FOLIC ACID 1 MG PO TABS: 1.0000 mg | ORAL_TABLET | Freq: Every day | ORAL | Status: DC

## 2014-05-03 MED ORDER — THIAMINE HCL 100 MG PO TABS: 100.0000 mg | ORAL_TABLET | Freq: Every day | ORAL | Status: DC

## 2014-05-03 MED ORDER — PANTOPRAZOLE SODIUM 40 MG PO TBEC: 40.0000 mg | DELAYED_RELEASE_TABLET | Freq: Two times a day (BID) | ORAL | Status: DC

## 2014-05-03 MED ORDER — ASPIRIN 325 MG PO TBEC: 325.0000 mg | DELAYED_RELEASE_TABLET | Freq: Every day | ORAL | Status: DC

## 2014-05-03 NOTE — Discharge Summary (Signed)
Physician Discharge Summary  Bradley SilviusBryan Gary [id number] DOB: Oct 01, 1970 DOA: 05/01/2014  PCP: Grayce SessionsEDWARDS, MICHELLE P, NP  Admit date: 05/01/2014 Discharge date: 05/03/2014  Time spent: 35 minutes  Recommendations for Outpatient Follow-up:  AA meetings GI referral   Discharge Diagnoses:  Principal Problem:   Chest pain Active Problems:   Essential hypertension   Hyperlipidemia   Macrocytic anemia   Thrombocytopenia   Cardiomyopathy, ischemic   Alcohol withdrawal   LFT elevation   Discharge Condition: improved  Diet recommendation: cardiac  Filed Weights   05/01/14 0226  Weight: 54.432 kg (120 lb)    History of present illness:  Bradley Compton is a 43 y.o. male with history of CAD status post stent[facility], hypertension hyperlipidemia presents to the ER because of chest pain. Patient started experiencing chest pain around 1 AM which was left-sided stabbing in nature and nonradiating associated with nausea vomiting. EMS was called immediately and patient was brought to the ER. En route patient was given nitroglycerin and as per the ER physician patient blood pressure had dropped and was given 1 L normal saline bolus. In the ER patient was chest pain-free. Cardiac markers were negative EKG was showing ST changes in the lateral leads. Patient has been admitted for further workup of ACS. Patient said he has been some abdominal discomfort over the last 2 days which is nonspecific and abdomen appears benign on exam. LFTs and lipase are pending. Patient states he still smokes cigarettes and drinks coffee every day. Patient has had previous history of GI bleed and was found to have gastric ulcer. Patient only has one kidney and has had undergone previous nephrectomy.  Hospital Course:  Chest pain: MI ruled out. Cardiac cath from 5/15 reviewed. Had previous LAD and right coronary stents which looked fine. EF on cath was 40%. Apical  hypokinesis present. Continue aspirin, Plavix, statin, ACE inhibitor. Continue empiric Protonix. Cardiology without plans for further workup. Transaminases slightly high. Alkaline phosphatase okay. Lipase okay. -outpatient GI referral for possible EGD  Essential hypertension:   Hyperlipidemia  Macrocytic anemia: B12 ok. Folate pending  Thrombocytopenia, alcohol related  Cardiomyopathy, ischemic: No signs of CHF continue ACE inhibitor: EF on echo here essentially the same as on catheterization earlier this year.  Alcohol withdrawal: resolved, only mild tremors, does not have plans to stop drinking- has been to treatment facilities in the past Increased LFTs secondary to alcohol/fatty liver.  Procedures:    Consultations:    Discharge Exam: Filed Vitals:   05/03/14 1017  BP: 117/77  Pulse:   Temp:   Resp:     General: A+Ox3, NAD, mild tremors Cardiovascular: rrr Respiratory: clear  Discharge Instructions You were cared for by a hospitalist during your hospital stay. If you have any questions about your discharge medications or the care you received while you were in the hospital after you are discharged, you can call the unit and asked to speak with the hospitalist on call if the hospitalist that took care of you is not available. Once you are discharged, your primary care physician will handle any further medical issues. Please note that NO REFILLS for any discharge medications will be authorized once you are discharged, as it is imperative that you return to your primary care physician (or establish a relationship with a primary care physician if you do not have one) for your aftercare needs so that they can reassess your need for medications and monitor your lab values.  Discharge  Instructions    Diet - low sodium heart healthy    Complete by:  As directed      Discharge instructions    Complete by:  As directed   Can take protonix or use any other OTC PPI      Increase activity slowly    Complete by:  As directed           Current Discharge Medication List    START taking these medications   Details  aspirin EC 325 MG EC tablet Take 1 tablet (325 mg total) by mouth daily. Qty: 30 tablet, Refills: 0    folic acid (FOLVITE) 1 MG tablet Take 1 tablet (1 mg total) by mouth daily. Qty: 30 tablet, Refills: 0    pantoprazole (PROTONIX) 40 MG tablet Take 1 tablet (40 mg total) by mouth 2 (two) times daily. Qty: 60 tablet, Refills: 0    thiamine 100 MG tablet Take 1 tablet (100 mg total) by mouth daily. Qty: 30 tablet, Refills: 0      CONTINUE these medications which have NOT CHANGED   Details  atorvastatin (LIPITOR) 40 MG tablet Take 40 mg by mouth at bedtime as needed.    carvedilol (COREG) 12.5 MG tablet Take 12.5 mg by mouth 2 (two) times daily with a meal.    clopidogrel (PLAVIX) 75 MG tablet Take 75 mg by mouth daily.    diphenhydrAMINE (BENADRYL) 25 MG tablet Take 25 mg by mouth at bedtime as needed for sleep.    escitalopram (LEXAPRO) 10 MG tablet Take 5 mg by mouth at bedtime.    lisinopril (PRINIVIL,ZESTRIL) 20 MG tablet Take 20 mg by mouth daily.    Melatonin-Pyridoxine (MELATIN PO) Take 1 tablet by mouth at bedtime as needed (sleep).    Multiple Vitamin (MULTIVITAMIN WITH MINERALS) TABS tablet Take 1 tablet by mouth daily.       Allergies  Allergen Reactions  . Other Itching and Rash    Evergreen trees      The results of significant diagnostics from this hospitalization (including imaging, microbiology, ancillary and laboratory) are listed below for reference.    Significant Diagnostic Studies: Dg Chest 2 View  05/01/2014   CLINICAL DATA:  Left-sided chest pain with vomiting.  EXAM: CHEST  2 VIEW  COMPARISON:  None.  FINDINGS: Normal heart size and mediastinal contours. There is extensive coronary stenting in the left circulation. There is no edema, consolidation, effusion, or pneumothorax. Minimal scarring or  atelectasis in the left mid lung.  IMPRESSION: 1. No active cardiopulmonary disease. 2. Extensive LAD stenting.   Electronically Signed   By: Tiburcio Pea M.D.   On: 05/01/2014 03:04   US Abdomen Complete  05/01/2014   CLINICAL DATA:  Abdominal pain R10.9 (ICD-10-CM) LFT elevation R79.89 (ICD-10-CM) Alcoholism F10.20 (ICD-10-CM)  EXAM: ULTRASOUND ABDOMEN COMPLETE  COMPARISON:  None.  FINDINGS: Gallbladder: Contracted gallbladder. No stones or sludge identified. Gallbladder wall measures 2 mm, normal. No sonographic Murphy sign.  Common bile duct: Diameter: 9 mm, dilated for age. No visible common bile duct stone. The proximal and distal portions of the common bile duct appear normal diameter, suggesting a choledochal cyst accounting for over the prominence.  Liver: Echogenic, compatible with hepatosteatosis. No intrahepatic biliary ductal dilation.  IVC: No abnormality visualized.  Pancreas: Visualized portion unremarkable.  Spleen: Size and appearance within normal limits.  Right Kidney: Length: 10.7 cm. Echogenicity within normal limits. No mass or hydronephrosis visualized.  Left Kidney:  Nephrectomy.  Abdominal aorta: No  aneurysm visualized.  Other findings: None.  IMPRESSION: 1. Dilation of the middle portion of the common bile duct in 9 mm. 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 MRCP in follow-up could confirm. Non-emergent MRI should be deferred until patient has been discharged for the acute illness, and can optimally cooperate with positioning and breath-holding instructions. 2. LEFT nephrectomy. 3. Echogenic liver compatible with hepatosteatosis.   Electronically Signed   By: Andreas NewportGeoffrey  Lamke M.D.   On: 05/01/2014 20:50    Microbiology: No results found for this or any previous visit (from the past 240 hour(s)).   Labs: Basic Metabolic Panel:  Recent Labs Lab 05/01/14 0228  05/01/14 0742 05/01/14 1324 05/02/14 0505  NA 133*  --   --  137  K 3.5*  --   --  4.6  CL 93*  --   --  104  CO2 24  --   --  24  GLUCOSE 97  --   --  88  BUN 29*  --   --  19  CREATININE 1.54* 1.37*  --  1.10  CALCIUM 9.4  --   --  9.2  MG  --   --  1.9  --    Liver Function Tests:  Recent Labs Lab 05/01/14 0742  AST 105*  ALT 102*  ALKPHOS 67  BILITOT 0.6  PROT 7.2  ALBUMIN 3.8    Recent Labs Lab 05/01/14 0228  LIPASE 58   No results for input(s): AMMONIA in the last 168 hours. CBC:  Recent Labs Lab 05/01/14 0228 05/01/14 0742 05/02/14 0505  WBC 6.7 8.0 6.5  HGB 12.7* 12.7* 12.0*  HCT 37.2* 37.7* 36.9*  MCV 102.5* 102.7* 102.8*  PLT 119* 120* 115*   Cardiac Enzymes:  Recent Labs Lab 05/01/14 0742 05/01/14 1324 05/01/14 2109  TROPONINI <0.30 <0.30 <0.30   BNP: BNP (last 3 results) No results for input(s): PROBNP in the last 8760 hours. CBG:  Recent Labs Lab 05/01/14 1811 05/01/14 2340 05/02/14 0604 05/02/14 1134 05/03/14 0634  GLUCAP 74 119* 76 103* 103*       Signed:  Hasheem Voland  [facility] 05/03/2014, 11:12 AM

## 2014-05-03 NOTE — Progress Notes (Signed)
05/03/2014 12:34 PM  Nursing note Discharge avs form, medications taken today and those due this evening given and reviewed with pt and family member. Follow up appointments and when to call MD reviewed. RX given and reviewed. D/c iv line. D/c home per orders.  Gerold Sar, Blanchard KelchStephanie Ingold

## 2015-02-09 ENCOUNTER — Inpatient Hospital Stay
Admit: 2015-02-09 | Discharge: 2015-02-10 | DRG: 885 | Disposition: A | Discharge status: Home / Self Care | Payer: Federal, State, Local not specified - Other | Source: Other Acute Inpatient Hospital | Attending: Psychiatry | Admitting: Psychiatry

## 2015-02-09 ENCOUNTER — Encounter: Payer: Self-pay | Admitting: Psychiatry

## 2015-02-09 DIAGNOSIS — I252 Old myocardial infarction: Secondary | ICD-10-CM

## 2015-02-09 DIAGNOSIS — Z9049 Acquired absence of other specified parts of digestive tract: Secondary | ICD-10-CM | POA: Diagnosis present

## 2015-02-09 DIAGNOSIS — Z811 Family history of alcohol abuse and dependence: Secondary | ICD-10-CM

## 2015-02-09 DIAGNOSIS — Z79899 Other long term (current) drug therapy: Secondary | ICD-10-CM

## 2015-02-09 DIAGNOSIS — Z8249 Family history of ischemic heart disease and other diseases of the circulatory system: Secondary | ICD-10-CM

## 2015-02-09 DIAGNOSIS — F1020 Alcohol dependence, uncomplicated: Secondary | ICD-10-CM | POA: Diagnosis present

## 2015-02-09 DIAGNOSIS — Z888 Allergy status to other drugs, medicaments and biological substances status: Secondary | ICD-10-CM

## 2015-02-09 DIAGNOSIS — F332 Major depressive disorder, recurrent severe without psychotic features: Secondary | ICD-10-CM | POA: Diagnosis not present

## 2015-02-09 DIAGNOSIS — I10 Essential (primary) hypertension: Secondary | ICD-10-CM | POA: Diagnosis present

## 2015-02-09 DIAGNOSIS — Z9889 Other specified postprocedural states: Secondary | ICD-10-CM

## 2015-02-09 DIAGNOSIS — R45851 Suicidal ideations: Secondary | ICD-10-CM | POA: Diagnosis present

## 2015-02-09 DIAGNOSIS — F17200 Nicotine dependence, unspecified, uncomplicated: Secondary | ICD-10-CM | POA: Diagnosis present

## 2015-02-09 DIAGNOSIS — Z7982 Long term (current) use of aspirin: Secondary | ICD-10-CM

## 2015-02-09 DIAGNOSIS — F17210 Nicotine dependence, cigarettes, uncomplicated: Secondary | ICD-10-CM | POA: Diagnosis present

## 2015-02-09 MED ORDER — ATORVASTATIN CALCIUM 20 MG PO TABS
80.0000 mg | ORAL_TABLET | Freq: Every day | ORAL | Status: DC
  Administered 2015-02-09: 80 mg via ORAL
  Filled 2015-02-09: qty 4

## 2015-02-09 MED ORDER — CLOPIDOGREL BISULFATE 75 MG PO TABS
75.0000 mg | ORAL_TABLET | Freq: Every day | ORAL | Status: DC
Start: 2015-02-09 — End: 2015-02-10
  Administered 2015-02-09 – 2015-02-10 (×2): 75 mg via ORAL
  Filled 2015-02-09 (×2): qty 1

## 2015-02-09 MED ORDER — CARVEDILOL 6.25 MG PO TABS
6.2500 mg | ORAL_TABLET | Freq: Two times a day (BID) | ORAL | Status: DC
Start: 2015-02-09 — End: 2015-02-10
  Administered 2015-02-09 – 2015-02-10 (×2): 6.25 mg via ORAL
  Filled 2015-02-09 (×2): qty 1

## 2015-02-09 MED ORDER — MAGNESIUM HYDROXIDE 400 MG/5ML PO SUSP: 30.0000 mL | Freq: Every day | ORAL | Status: DC | PRN

## 2015-02-09 MED ORDER — LISINOPRIL 10 MG PO TABS
20.0000 mg | ORAL_TABLET | Freq: Every day | ORAL | Status: DC
  Administered 2015-02-09 – 2015-02-10 (×2): 20 mg via ORAL
  Filled 2015-02-09 (×3): qty 2

## 2015-02-09 MED ORDER — ACETAMINOPHEN 325 MG PO TABS: 650.0000 mg | ORAL_TABLET | Freq: Four times a day (QID) | ORAL | Status: DC | PRN

## 2015-02-09 MED ORDER — ALUM & MAG HYDROXIDE-SIMETH 200-200-20 MG/5ML PO SUSP: 30.0000 mL | ORAL | Status: DC | PRN

## 2015-02-09 MED ORDER — NICOTINE 21 MG/24HR TD PT24
21.0000 mg | MEDICATED_PATCH | Freq: Every day | TRANSDERMAL | Status: DC
  Administered 2015-02-10: 21 mg via TRANSDERMAL
  Filled 2015-02-09: qty 1

## 2015-02-09 MED ORDER — TRAZODONE HCL 100 MG PO TABS
100.0000 mg | ORAL_TABLET | Freq: Every evening | ORAL | Status: DC | PRN
  Administered 2015-02-09: 100 mg via ORAL
  Filled 2015-02-09: qty 1

## 2015-02-09 MED ORDER — ESCITALOPRAM OXALATE 10 MG PO TABS
10.0000 mg | ORAL_TABLET | Freq: Every day | ORAL | Status: DC
  Administered 2015-02-09 – 2015-02-10 (×2): 10 mg via ORAL
  Filled 2015-02-09 (×2): qty 1

## 2015-02-09 NOTE — Consult Note (Signed)
Pt spent most of the evening in his room, pt oriented x3, labile mood and affect, denies any si/hi/vah, compliant with medications. Denies any withdrawal symptoms at this time. No concerns voiced, safety maintained.

## 2015-02-09 NOTE — Progress Notes (Signed)
Patient is a 44 year old CM male admitted under IVC to Dr. Jennet Maduro services from [place] for apparent suicidal ideation with attempt at jumping out of car. Per IVC paperwork patient has been drinking and got into conflict with girlfriend and threatened to harm self. Upon arrival to unit skin assessment and contarband search were performed by Bedelia Person RN. Reportedly patient has multiple surgical scars generalized over body. No contraband was found. Patient was oriented to unit and room. Offered meal tray. q 15 minutes checks started for safety. No further distress noted. Patient saw Dr. Demetrius Charity promptly.
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
## 2015-02-09 NOTE — Tx Team (Signed)
Initial Interdisciplinary Treatment Plan   PATIENT STRESSORS: Health problems Loss of Girl friend on again off again Substance abuse   PATIENT STRENGTHS: Ability for insight Licensed conveyancer Physical Health   PROBLEM LIST: Problem List/Patient Goals Date to be addressed Date deferred Reason deferred Estimated date of resolution  Depression  02/09/15     Substance  Abuse  02/09/15                                                DISCHARGE CRITERIA:  Adequate post-discharge living arrangements Improved stabilization in mood, thinking, and/or behavior Medical problems require only outpatient monitoring  PRELIMINARY DISCHARGE PLAN: Outpatient therapy Return to previous living arrangement  PATIENT/FAMIILY INVOLVEMENT: This treatment plan has been presented to and reviewed with the patient, Bradley Compton, and/or family member.  The patient and family have been given the opportunity to ask questions and make suggestions.  Gwen A Farrish 02/09/2015, 6:52 PM

## 2015-02-09 NOTE — BHH Suicide Risk Assessment (Signed)
[facility] Admission Suicide Risk Assessment   Nursing information obtained from:    Demographic factors:    Current Mental Status:    Loss Factors:    Historical Factors:    Risk Reduction Factors:    Total Time spent with patient: 1 hour Principal Problem: Severe recurrent major depression without psychotic features Diagnosis:   Patient Active Problem List   Diagnosis Date Noted  . Severe recurrent major depression without psychotic features [F33.2] 02/09/2015  . Tobacco use disorder [Z72.0] 02/09/2015  . Major depressive disorder, recurrent, severe without psychotic features [F33.2] 02/09/2015  . Cardiomyopathy, ischemic [I25.5] 05/02/2014  . Alcohol withdrawal [F10.239] 05/02/2014  . LFT elevation [R79.89]   . Chest pain [R07.9] 05/01/2014  . Essential hypertension [I10] 05/01/2014  . Hyperlipidemia [E78.5] 05/01/2014  . Macrocytic anemia [D53.9] 05/01/2014  . Thrombocytopenia [D69.6] 05/01/2014     Continued Clinical Symptoms:    The "Alcohol Use Disorders Identification Test", Guidelines for Use in Primary Care, Second Edition.  World Science writer [facility]). Score between 0-7:  no or low risk or alcohol related problems. Score between 8-15:  moderate risk of alcohol related problems. Score between 16-19:  high risk of alcohol related problems. Score 20 or above:  warrants further diagnostic evaluation for alcohol dependence and treatment.   CLINICAL FACTORS:   Depression:   Comorbid alcohol abuse/dependence Alcohol/Substance Abuse/Dependencies   Musculoskeletal: Strength & Muscle Tone: within normal limits Gait & Station: normal Patient leans: N/A  Psychiatric Specialty Exam: Physical Exam  Nursing note and vitals reviewed. Constitutional: He is oriented to person, place, and time. He appears well-developed and well-nourished.  HENT:  Head: Normocephalic and atraumatic.  Eyes: Conjunctivae and EOM are normal. Pupils are equal, round, and reactive to light.   Neck: Normal range of motion. Neck supple.  Cardiovascular: Normal rate, regular rhythm and normal heart sounds.   Respiratory: Effort normal and breath sounds normal.  GI: Soft. Bowel sounds are normal.  Musculoskeletal: Normal range of motion.  Neurological: He is alert and oriented to person, place, and time.  Skin: Skin is warm and dry.    Review of Systems  All other systems reviewed and are negative.   Blood pressure 169/95, pulse 62, temperature 97.3 F (36.3 C), temperature source Oral, resp. rate 18, height  (1.753 m), weight 50.803 kg (112 lb).Body mass index is 16.53 kg/(m^2).  General Appearance: Disheveled  Eye Contact::  Good  Speech:  Clear and Coherent  Volume:  Normal  Mood:  Anxious  Affect:  Appropriate  Thought Process:  Goal Directed  Orientation:  Full (Time, Place, and Person)  Thought Content:  Delusions  Suicidal Thoughts:  No  Homicidal Thoughts:  No  Memory:  Immediate;   Fair Recent;   Fair Remote;   Fair  Judgement:  Fair  Insight:  Fair  Psychomotor Activity:  Normal  Concentration:  Fair  Recall:  [employer] of Knowledge:Fair  Language: Fair  Akathisia:  No  Handed:  Right  AIMS (if indicated):     Assets:  Communication Skills  Sleep:     Cognition: WNL  ADL's:  Intact     COGNITIVE FEATURES THAT CONTRIBUTE TO RISK:  None    SUICIDE RISK:   Minimal: No identifiable suicidal ideation.  Patients presenting with no risk factors but with morbid ruminations; may be classified as minimal risk based on the severity of the depressive symptoms  PLAN OF CARE: Hospital admission, medication management, substance abuse, discharge planning.  Medical  Decision Making:  New problem, with additional work up planned, Review of Psycho-Social Stressors (1), Review or order clinical lab tests (1), Review of Medication Regimen & Side Effects (2) and Review of New Medication or Change in Dosage (2)   Mr. Folkert Is a 44 year old male with a  history of alcoholism and depression admitted for suicidal threats while drunk 3 days ago.  1. Suicidal ideation. The patient adamantly denies any intention or plans to hurt himself or others.  2. Mood. He has been treated with Lexapro with success. He was restarted on Lexapro at [facility] will continue.  3. Alcoholism. The patient reports no symptoms of withdrawal. We will monitor.  4. Substance abuse treatment. He is not interested he likes to drink.  5. Coronary artery disease. We will continue antihypertensives, Lasix, and cholesterol lowering drugs.  6. Smoking. Nicotine patch is available.  7. Disposition. He is not allowed to return with his girlfriend. He was discharged with his mother. Follow up with [facility].     I certify that inpatient services furnished can reasonably be expected to improve the patient's condition.   Ryonna Cimini 02/09/2015, 5:52 PM

## 2015-02-09 NOTE — Plan of Care (Signed)
Problem: Ineffective individual coping Goal: LTG: Patient will report a decrease in negative feelings Outcome: Progressing Patient denies suicidal ideation at admission.

## 2015-02-09 NOTE — Plan of Care (Signed)
Problem: Consults Goal: [facility] General Treatment Patient Education Outcome: Progressing Pt is progressing towards accomplishing this goa.

## 2015-02-09 NOTE — H&P (Signed)
Psychiatric Admission Assessment Adult  Patient Identification: Bradley Compton MRN:  [id number] Date of Evaluation:  02/09/2015 Chief Complaint:  Unspecified Depression Principal Diagnosis: Severe recurrent major depression without psychotic features Diagnosis:   Patient Active Problem List   Diagnosis Date Noted  . Severe recurrent major depression without psychotic features [F33.2] 02/09/2015  . Tobacco use disorder [Z72.0] 02/09/2015  . Major depressive disorder, recurrent, severe without psychotic features [F33.2] 02/09/2015  . Cardiomyopathy, ischemic [I25.5] 05/02/2014  . Alcohol withdrawal [F10.239] 05/02/2014  . LFT elevation [R79.89]   . Chest pain [R07.9] 05/01/2014  . Essential hypertension [I10] 05/01/2014  . Hyperlipidemia [E78.5] 05/01/2014  . Macrocytic anemia [D53.9] 05/01/2014  . Thrombocytopenia [D69.6] 05/01/2014   History of Present Illness:  Identifying data. Mr. Kleinsasser is a 44 year old male with a history of depression and alcoholism.  Chief complaint. "I was trapped, I wanted to get away."  History of present illness. The patient has a long history of drinking with minimal periods of sobriety. He enjoys his drinks because it relaxes him. He has not been drinking heavily lately but didn't get drunk last weekend. His girlfriend called police and he was taken to [facility]. He was threatening suicide while drunk. He tells me that he never was suicidal but was trying to get away from his girlfriend as he felt trapped. He denies any symptoms of depression, anxiety, or psychosis. He denies symptoms suggestive of bipolar mania. He does not use other drugs and alcohol. He drinks whenever he has money because he likes it.  Past psychiatric history. He was hospitalized once several years ago for worsening of depression in the face of multiple stressors. He will to [facility] substance abuse treatment program and then continued in caring services. He was able to maintain  sobriety for extended period of time going to AA. It ended when he got a job.  Family psychiatric history. Mother with alcoholism and mental illness.  Social history. He graduated from high school and some college education. Last time he filed taxes wasn't 2009. He does all the jobs. He lives with his mother which he hates. He does not believe that he will be able to stay with his girlfriend. He has no health insurance.   Associated Signs/Symptoms: Total Time spent with patient: 1 hour  Past Psychiatric History:   Risk to Self: Is patient at risk for suicide?: No Risk to Others:   Prior Inpatient Therapy:   Prior Outpatient Therapy:    Alcohol Screening:   Substance Abuse History in the last 12 months:  Yes.   Consequences of Substance Abuse: Negative Previous Psychotropic Medications: Yes  Psychological Evaluations: No  Past Medical History:  Past Medical History  Diagnosis Date  . Myocardial infarction     2012  . Coronary artery disease     Xience V 2.5 x 15 mid LAD x 2, RCA Xience V 3.0 x 15 x 1, Xience V 3. x 23 mm x 1, 2012. [facility]  . Hypertension   . Hyperlipidemia   . GI bleed   . Nephrolithiasis   . Alcoholism     Past Surgical History  Procedure Laterality Date  . Chest tube placement    . Nephrectomy      2013   Family History:  Family History  Problem Relation Age of Onset  . Hypertension Mother   . CAD Maternal Uncle   . CAD Mother 62    PCI   Family Psychiatric  History:  Social History:  History  Alcohol Use  . 0.0 oz/week  . 0 Standard drinks or equivalent per week    Comment: Vodka everyday.     History  Drug Use Not on file    Social History   Social History  . Marital Status: Unknown    Spouse Name: N/A  . Number of Children: N/A  . Years of Education: N/A   Social History Main Topics  . Smoking status: Current Every Day Smoker  . Smokeless tobacco: None  . Alcohol Use: 0.0 oz/week    0 Standard drinks or  equivalent per week     Comment: Vodka everyday.  . Drug Use: None  . Sexual Activity: Not Asked   Other Topics Concern  . None   Social History Narrative   Lives with girlfriend.     Additional Social History:                         Allergies:   Allergies  Allergen Reactions  . Other Itching and Rash    Evergreen trees   Lab Results: No results found for this or any previous visit (from the past 48 hour(s)).  Metabolic Disorder Labs:  No results found for: HGBA1C, MPG No results found for: PROLACTIN No results found for: CHOL, TRIG, HDL, CHOLHDL, VLDL, LDLCALC  Current Medications: Current Facility-Administered Medications  Medication Dose Route Frequency Provider Last Rate Last Dose  . acetaminophen (TYLENOL) tablet 650 mg  650 mg Oral Q6H PRN Jolanta B Pucilowska, MD      . alum & mag hydroxide-simeth (MAALOX/MYLANTA) 200-200-20 MG/5ML suspension 30 mL  30 mL Oral Q4H PRN Jolanta B Pucilowska, MD      . atorvastatin (LIPITOR) tablet 80 mg  80 mg Oral q1800 Jolanta B Pucilowska, MD      . carvedilol (COREG) tablet 6.25 mg  6.25 mg Oral BID WC Jolanta B Pucilowska, MD      . clopidogrel (PLAVIX) tablet 75 mg  75 mg Oral Daily Jolanta B Pucilowska, MD      . escitalopram (LEXAPRO) tablet 10 mg  10 mg Oral Daily Jolanta B Pucilowska, MD      . lisinopril (PRINIVIL,ZESTRIL) tablet 20 mg  20 mg Oral Daily Jolanta B Pucilowska, MD      . magnesium hydroxide (MILK OF MAGNESIA) suspension 30 mL  30 mL Oral Daily PRN Shari Prows, MD      . Melene Muller ON 02/10/2015] nicotine (NICODERM CQ - dosed in mg/24 hours) patch 21 mg  21 mg Transdermal Q0600 Jolanta B Pucilowska, MD      . traZODone (DESYREL) tablet 100 mg  100 mg Oral QHS PRN Jolanta B Pucilowska, MD       PTA Medications: Prescriptions prior to admission  Medication Sig Dispense Refill Last Dose  . aspirin EC 325 MG EC tablet Take 1 tablet (325 mg total) by mouth daily. 30 tablet 0   . atorvastatin  (LIPITOR) 40 MG tablet Take 40 mg by mouth at bedtime as needed.   04/30/2014 at Unknown time  . carvedilol (COREG) 12.5 MG tablet Take 12.5 mg by mouth 2 (two) times daily with a meal.   04/30/2014 at 2330  . clopidogrel (PLAVIX) 75 MG tablet Take 75 mg by mouth daily.   04/30/2014 at Unknown time  . diphenhydrAMINE (BENADRYL) 25 MG tablet Take 25 mg by mouth at bedtime as needed for sleep.   04/30/2014 at Unknown time  . escitalopram (LEXAPRO) 10  MG tablet Take 5 mg by mouth at bedtime.   04/30/2014 at Unknown time  . folic acid (FOLVITE) 1 MG tablet Take 1 tablet (1 mg total) by mouth daily. 30 tablet 0   . lisinopril (PRINIVIL,ZESTRIL) 20 MG tablet Take 20 mg by mouth daily.   04/30/2014 at Unknown time  . Melatonin-Pyridoxine (MELATIN PO) Take 1 tablet by mouth at bedtime as needed (sleep).   Past Week at Unknown time  . Multiple Vitamin (MULTIVITAMIN WITH MINERALS) TABS tablet Take 1 tablet by mouth daily.   04/30/2014 at Unknown time  . pantoprazole (PROTONIX) 40 MG tablet Take 1 tablet (40 mg total) by mouth 2 (two) times daily. 60 tablet 0   . thiamine 100 MG tablet Take 1 tablet (100 mg total) by mouth daily. 30 tablet 0     Musculoskeletal: Strength & Muscle Tone: within normal limits Gait & Station: normal Patient leans: N/A  Psychiatric Specialty Exam: Physical Exam  Nursing note and vitals reviewed.   Review of Systems  All other systems reviewed and are negative.   Blood pressure 169/95, pulse 62, temperature 97.3 F (36.3 C), temperature source Oral, resp. rate 18, height  (1.753 m), weight 50.803 kg (112 lb).Body mass index is 16.53 kg/(m^2).  See SRA.                                                  Sleep:        Treatment Plan Summary: Daily contact with patient to assess and evaluate symptoms and progress in treatment and Medication management   Mr. Habermann Is a 44 year old male with a history of alcoholism and depression admitted  for suicidal threats while drunk 3 days ago.  1. Suicidal ideation. The patient adamantly denies any intention or plans to hurt himself or others.  2. Mood. He has been treated with Lexapro with success. He was restarted on Lexapro at [facility] will continue.  3. Alcoholism. The patient reports no symptoms of withdrawal. We will monitor.  4. Substance abuse treatment. He is not interested he likes to drink.  5. Coronary artery disease. We will continue antihypertensives, Lasix, and cholesterol lowering drugs.  6. Smoking. Nicotine patch is available.  7. Disposition. He is not allowed to return with his girlfriend. He was discharged with his mother. Follow up with [facility].   Observation Level/Precautions:  15 minute checks  Laboratory:  CBC Chemistry Profile UDS UA  Psychotherapy:    Medications:    Consultations:    Discharge Concerns:    Estimated LOS:  Other:     I certify that inpatient services furnished can reasonably be expected to improve the patient's condition.   Jolanta Pucilowska 9/26/20165:59 PM

## 2015-02-10 DIAGNOSIS — F332 Major depressive disorder, recurrent severe without psychotic features: Secondary | ICD-10-CM | POA: Diagnosis not present

## 2015-02-10 LAB — CBC
HEMATOCRIT: 41.9 % (ref 40.0–52.0)
Hemoglobin: 14.1 g/dL (ref 13.0–18.0)
MCH: 34 pg (ref 26.0–34.0)
MCHC: 33.6 g/dL (ref 32.0–36.0)
MCV: 101 fL — ABNORMAL HIGH (ref 80.0–100.0)
Platelets: 199 10*3/uL (ref 150–440)
RBC: 4.15 MIL/uL — ABNORMAL LOW (ref 4.40–5.90)
RDW: 13.9 % (ref 11.5–14.5)
WBC: 6.4 10*3/uL (ref 3.8–10.6)

## 2015-02-10 MED ORDER — ESCITALOPRAM OXALATE 10 MG PO TABS: 5.0000 mg | ORAL_TABLET | Freq: Every day | ORAL | Status: DC

## 2015-02-10 MED ORDER — TRAZODONE HCL 100 MG PO TABS: 100.0000 mg | ORAL_TABLET | Freq: Every evening | ORAL | Status: DC | PRN

## 2015-02-10 MED ORDER — ATORVASTATIN CALCIUM 80 MG PO TABS: 80.0000 mg | ORAL_TABLET | Freq: Every day | ORAL | Status: DC

## 2015-02-10 MED ORDER — CARVEDILOL 12.5 MG PO TABS: 12.5000 mg | ORAL_TABLET | Freq: Two times a day (BID) | ORAL | Status: DC

## 2015-02-10 MED ORDER — CLOPIDOGREL BISULFATE 75 MG PO TABS: 75.0000 mg | ORAL_TABLET | Freq: Every day | ORAL | Status: DC

## 2015-02-10 MED ORDER — LISINOPRIL 20 MG PO TABS: 20.0000 mg | ORAL_TABLET | Freq: Every day | ORAL | Status: DC

## 2015-02-10 NOTE — Discharge Summary (Signed)
Physician Discharge Summary Note  Patient:  Bradley Compton is an 44 y.o., male MRN:  [id number] DOB:  1970-06-18 Patient phone:  [phone] (home)  Patient address:   [address] [place] [address],  Total Time spent with patient: 30 minutes  Date of Admission:  02/09/2015 Date of Discharge: 02/10/2015  Reason for Admission:  Suicidal ideation.  Identifying data. Mr. Berthold is a 44 year old male with a history of depression and alcoholism.  Chief complaint. "I was trapped, I wanted to get away."  History of present illness. The patient has a long history of drinking with minimal periods of sobriety. He enjoys his drinks because it relaxes him. He has not been drinking heavily lately but didn't get drunk last weekend. His girlfriend called police and he was taken to [facility]. He was threatening suicide while drunk. He tells me that he never was suicidal but was trying to get away from his girlfriend as he felt trapped. He denies any symptoms of depression, anxiety, or psychosis. He denies symptoms suggestive of bipolar mania. He does not use other drugs and alcohol. He drinks whenever he has money because he likes it.  Past psychiatric history. He was hospitalized once several years ago for worsening of depression in the face of multiple stressors. He will to [facility] substance abuse treatment program and then continued in caring services. He was able to maintain sobriety for extended period of time going to AA. It ended when he got a job.  Family psychiatric history. Mother with alcoholism and mental illness.  Social history. He graduated from high school and some college education. Last time he filed taxes wasn't 2009. He does all the jobs. He lives with his mother which he hates. He does not believe that he will be able to stay with his girlfriend. He has no health insurance.  Principal Problem: Severe recurrent major depression without psychotic features Discharge  Diagnoses: Patient Active Problem List   Diagnosis Date Noted  . Severe recurrent major depression without psychotic features [F33.2] 02/09/2015  . Tobacco use disorder [Z72.0] 02/09/2015  . Major depressive disorder, recurrent, severe without psychotic features [F33.2] 02/09/2015  . Cardiomyopathy, ischemic [I25.5] 05/02/2014  . Alcohol withdrawal [F10.239] 05/02/2014  . LFT elevation [R79.89]   . Chest pain [R07.9] 05/01/2014  . Essential hypertension [I10] 05/01/2014  . Hyperlipidemia [E78.5] 05/01/2014  . Macrocytic anemia [D53.9] 05/01/2014  . Thrombocytopenia [D69.6] 05/01/2014    Musculoskeletal: Strength & Muscle Tone: within normal limits Gait & Station: normal Patient leans: N/A  Psychiatric Specialty Exam: Physical Exam  Nursing note and vitals reviewed.   Review of Systems  All other systems reviewed and are negative.   Blood pressure 124/79, pulse 60, temperature 98.3 F (36.8 C), temperature source Oral, resp. rate 20, height  (1.753 m), weight 50.803 kg (112 lb).Body mass index is 16.53 kg/(m^2).  See SRA.                                                  Sleep:  Number of Hours: 7      Has this patient used any form of tobacco in the last 30 days? (Cigarettes, Smokeless Tobacco, Cigars, and/or Pipes) Yes, A prescription for an FDA-approved tobacco cessation medication was offered at discharge and the patient refused  Past Medical History:  Past Medical History  Diagnosis Date  .  Myocardial infarction     2012  . Coronary artery disease     Xience V 2.5 x 15 mid LAD x 2, RCA Xience V 3.0 x 15 x 1, Xience V 3. x 23 mm x 1, 2012. [facility]  . Hypertension   . Hyperlipidemia   . GI bleed   . Nephrolithiasis   . Alcoholism     Past Surgical History  Procedure Laterality Date  . Chest tube placement    . Nephrectomy      2013   Family History:  Family History  Problem Relation Age of Onset  . Hypertension Mother    . CAD Maternal Uncle   . CAD Mother 25    PCI   Social History:  History  Alcohol Use  . 0.0 oz/week  . 0 Standard drinks or equivalent per week    Comment: Vodka everyday.     History  Drug Use Not on file    Social History   Social History  . Marital Status: Unknown    Spouse Name: N/A  . Number of Children: N/A  . Years of Education: N/A   Social History Main Topics  . Smoking status: Current Every Day Smoker  . Smokeless tobacco: None  . Alcohol Use: 0.0 oz/week    0 Standard drinks or equivalent per week     Comment: Vodka everyday.  . Drug Use: None  . Sexual Activity: Not Asked   Other Topics Concern  . None   Social History Narrative   Lives with girlfriend.      Past Psychiatric History: Hospitalizations:  Outpatient Care:  Substance Abuse Care:  Self-Mutilation:  Suicidal Attempts:  Violent Behaviors:   Risk to Self: Suicidal Ideation: Yes-Currently Present Suicidal Intent: Yes-Currently Present Is patient at risk for suicide?: Yes Suicidal Plan?: Yes-Currently Present Specify Current Suicidal Plan: Pt attempted to jump out of moving vehicle at Access to Means:  (Unknown) What has been your use of drugs/alcohol within the last 12 months?: Vodka: 1 pint/daily How many times?: 1 (2014 attempted to stab self in chest) Other Self Harm Risks: Previous suicide attempt, recent non compliance with medication Triggers for Past Attempts: Unknown Intentional Self Injurious Behavior:  (None Reported) Risk to Others: Homicidal Ideation: No Thoughts of Harm to Others: No Current Homicidal Intent: No Current Homicidal Plan: No Access to Homicidal Means: No Identified Victim: N/A History of harm to others?: No Assessment of Violence: None Noted Violent Behavior Description: N/A Does patient have access to weapons?:  (Unknown) Criminal Charges Pending?: No Does patient have a court date: No Prior Inpatient Therapy: Prior Inpatient Therapy:  No Prior Therapy Dates: N/A Prior Therapy Facilty/Provider(s): N/A Reason for Treatment: N/A Prior Outpatient Therapy: Prior Outpatient Therapy:  (Current OPT) Prior Therapy Dates: N/A Prior Therapy Facilty/Provider(s): Not reported Reason for Treatment: Depression Does patient have an ACCT team?: No Does patient have Intensive In-House Services?  : No Does patient have [facility] services? : No Does patient have [facility] services?: No  Level of Care:  OP  Hospital Course:   Mr. Woodford Is a 44 year old male with a history of alcoholism and depression admitted for suicidal threats while drunk 3 days ago.  1. Suicidal ideation. The patient adamantly denies any intention or plans to hurt himself or others.  2. Mood. He has been treated with Lexapro with success. He was restarted on Lexapro at [facility]. He will continue.on Lexapro.  3. Alcoholism. The patient did not need alcohol detox.  Vital signs were stable.   4. Substance abuse treatment. He is not interested. He likes to drink.  5. Coronary artery disease. We continued antihypertensives, Lasix, and cholesterol lowering drugs.  6. Smoking. Nicotine patch was available.  7. Disposition. He is not allowed to return with his girlfriend. He was discharged with his mother. He will follow up with [facility].  Consults:  None  Significant Diagnostic Studies:  None  Discharge Vitals:   Blood pressure 124/79, pulse 60, temperature 98.3 F (36.8 C), temperature source Oral, resp. rate 20, height  (1.753 m), weight 50.803 kg (112 lb). Body mass index is 16.53 kg/(m^2). Lab Results:   Results for orders placed or performed during the hospital encounter of 02/09/15 (from the past 72 hour(s))  CBC     Status: Abnormal   Collection Time: 02/10/15  7:14 AM  Result Value Ref Range   WBC 6.4 3.8 - 10.6 K/uL   RBC 4.15 (L) 4.40 - 5.90 MIL/uL   Hemoglobin 14.1 13.0 - 18.0 g/dL   HCT 96.2 95.2 - 84.1 %   MCV 101.0 (H) 80.0 - 100.0 fL    MCH 34.0 26.0 - 34.0 pg   MCHC 33.6 32.0 - 36.0 g/dL   RDW 32.4 40.1 - 02.7 %   Platelets 199 150 - 440 K/uL    Physical Findings: AIMS:  , ,  ,  ,    CIWA:  CIWA-Ar Total: 3 COWS:      See Psychiatric Specialty Exam and Suicide Risk Assessment completed by Attending Physician prior to discharge.  Discharge destination:  Home  Is patient on multiple antipsychotic therapies at discharge:  No   Has Patient had three or more failed trials of antipsychotic monotherapy by history:  No    Recommended Plan for Multiple Antipsychotic Therapies: NA  Discharge Instructions    Diet - low sodium heart healthy    Complete by:  As directed      Increase activity slowly    Complete by:  As directed             Medication List    TAKE these medications      Indication   aspirin 325 MG EC tablet  Take 1 tablet (325 mg total) by mouth daily.      atorvastatin 80 MG tablet  Commonly known as:  LIPITOR  Take 1 tablet (80 mg total) by mouth daily at 6 PM.   Indication:  Cerebrovascular Accident or Stroke     carvedilol 12.5 MG tablet  Commonly known as:  COREG  Take 1 tablet (12.5 mg total) by mouth 2 (two) times daily with a meal.   Indication:  Chronic Angina Following Exercise, Stress, or Excitement     clopidogrel 75 MG tablet  Commonly known as:  PLAVIX  Take 1 tablet (75 mg total) by mouth daily.   Indication:  Heart Attack     diphenhydrAMINE 25 MG tablet  Commonly known as:  BENADRYL  Take 25 mg by mouth at bedtime as needed for sleep.      escitalopram 10 MG tablet  Commonly known as:  LEXAPRO  Take 0.5 tablets (5 mg total) by mouth at bedtime.   Indication:  Depression     folic acid 1 MG tablet  Commonly known as:  FOLVITE  Take 1 tablet (1 mg total) by mouth daily.      lisinopril 20 MG tablet  Commonly known as:  PRINIVIL,ZESTRIL  Take 1 tablet (20 mg  total) by mouth daily.   Indication:  High Blood Pressure     MELATIN PO  Take 1 tablet by mouth at  bedtime as needed (sleep).      multivitamin with minerals Tabs tablet  Take 1 tablet by mouth daily.      pantoprazole 40 MG tablet  Commonly known as:  PROTONIX  Take 1 tablet (40 mg total) by mouth 2 (two) times daily.      thiamine 100 MG tablet  Take 1 tablet (100 mg total) by mouth daily.      traZODone 100 MG tablet  Commonly known as:  DESYREL  Take 1 tablet (100 mg total) by mouth at bedtime as needed for sleep.   Indication:  Major Depressive Disorder         Follow-up recommendations:  Activity:  As tolerated. Diet:  No sodium heart healthy. Other:  P follow-up appointments.  Comments:    Total Discharge Time: 35 min.  Signed: Kristine Linea 02/10/2015, 11:44 AM

## 2015-02-10 NOTE — [facility] Group Notes (Signed)
[facility] Group Notes:  (Nursing/MHT/Case Management/Adjunct)  Date:  02/10/2015  Time:  2:13 PM  Type of Therapy:  Psychoeducational Skills  Participation Level:  Did Not Attend   Lynelle Smoke [facility] 02/10/2015, 2:13 PM

## 2015-02-10 NOTE — [facility] Suicide Risk Assessment (Signed)
[facility] Discharge Suicide Risk Assessment   Demographic Factors:  Male, Caucasian, Low socioeconomic status and Unemployed  Total Time spent with patient: 30 minutes  Musculoskeletal: Strength & Muscle Tone: within normal limits Gait & Station: normal Patient leans: N/A  Psychiatric Specialty Exam: Physical Exam  Nursing note and vitals reviewed.   Review of Systems  All other systems reviewed and are negative.   Blood pressure 124/79, pulse 60, temperature 98.3 F (36.8 C), temperature source Oral, resp. rate 20, height  (1.753 m), weight 50.803 kg (112 lb).Body mass index is 16.53 kg/(m^2).  General Appearance: Casual  Eye Contact::  Good  Speech:  Clear and Coherent409  Volume:  Normal  Mood:  Euthymic  Affect:  Appropriate  Thought Process:  Goal Directed  Orientation:  Full (Time, Place, and Person)  Thought Content:  WDL  Suicidal Thoughts:  No  Homicidal Thoughts:  No  Memory:  Immediate;   Fair Recent;   Fair Remote;   Fair  Judgement:  Fair  Insight:  Fair  Psychomotor Activity:  Normal  Concentration:  Fair  Recall:  [employer] of Knowledge:Fair  Language: Fair  Akathisia:  No  Handed:  Right  AIMS (if indicated):     Assets:  Communication Skills Physical Health Resilience  Sleep:  Number of Hours: 7  Cognition: WNL  ADL's:  Intact      Has this patient used any form of tobacco in the last 30 days? (Cigarettes, Smokeless Tobacco, Cigars, and/or Pipes) Yes, A prescription for an FDA-approved tobacco cessation medication was offered at discharge and the patient refused  Mental Status Per Nursing Assessment::   On Admission:     Current Mental Status by Physician: NA  Loss Factors: Financial problems/change in socioeconomic status  Historical Factors: Impulsivity  Risk Reduction Factors:   Sense of responsibility to family  Continued Clinical Symptoms:  Depression:   Comorbid alcohol abuse/dependence Alcohol/Substance  Abuse/Dependencies  Cognitive Features That Contribute To Risk:  None    Suicide Risk:  Minimal: No identifiable suicidal ideation.  Patients presenting with no risk factors but with morbid ruminations; may be classified as minimal risk based on the severity of the depressive symptoms  Principal Problem: Severe recurrent major depression without psychotic features Discharge Diagnoses:  Patient Active Problem List   Diagnosis Date Noted  . Severe recurrent major depression without psychotic features [F33.2] 02/09/2015  . Tobacco use disorder [Z72.0] 02/09/2015  . Major depressive disorder, recurrent, severe without psychotic features [F33.2] 02/09/2015  . Cardiomyopathy, ischemic [I25.5] 05/02/2014  . Alcohol withdrawal [F10.239] 05/02/2014  . LFT elevation [R79.89]   . Chest pain [R07.9] 05/01/2014  . Essential hypertension [I10] 05/01/2014  . Hyperlipidemia [E78.5] 05/01/2014  . Macrocytic anemia [D53.9] 05/01/2014  . Thrombocytopenia [D69.6] 05/01/2014      Plan Of Care/Follow-up recommendations:  Activity:  As tolerated. Diet:  Low sodium heart healthy. Other:  Keep follow-up appointments.  Is patient on multiple antipsychotic therapies at discharge:  No   Has Patient had three or more failed trials of antipsychotic monotherapy by history:  No  Recommended Plan for Multiple Antipsychotic Therapies: NA    Bradley Compton 02/10/2015, 11:41 AM

## 2015-02-10 NOTE — Progress Notes (Signed)
Recreation Therapy Notes  INPATIENT RECREATION THERAPY ASSESSMENT  Patient Details Name: Bradley Compton MRN: [id number] DOB: May 02, 1971 Today's Date: 02/10/2015  Patient Stressors: Family, Relationship  Coping Skills:   Isolate, Substance Abuse, Avoidance, Exercise, Art/Dance, Talking, Music, Sports  Personal Challenges: Stress Management, Trusting Others  Leisure Interests (2+):  Art - Draw, Art - Paint, Music - Listen, Individual - Other (Comment) (Watch movies)  Awareness of Community Resources:  Yes  Community Resources:  Other (Comment) (Art gallories)  Current Use: Yes  If no, Barriers?:    Patient Strengths:  Automotive engineer, creative  Patient Identified Areas of Improvement:  Trust others  Current Recreation Participation:  Working on Psychologist, educational projects, reading, watching movies and football  Patient Goal for Hospitalization:  To feel better about himself  [place] of Residence:  [place] of Residence:  [place]   Current [place] (including self-harm):  No  Current [place]:  No  Consent to Intern Participation: N/A  Due to patient d/c today, LRT will not develop a Recreational Therapy Care Plan.  Jacquelynn Cree, LRT/CTRS 02/10/2015, 6:21 PM

## 2015-02-10 NOTE — Tx Team (Signed)
Interdisciplinary Treatment Plan Update (Adult)  Date:  02/10/2015 Time Reviewed:  12:16 PM  Progress in Treatment: Attending groups: no Participating in groups:  no Taking medication as prescribed:  yes Tolerating medication:  yes Family/Significant othe contact made:  No family contact Patient understands diagnosis:  yes Discussing patient identified problems/goals with staff:  no Medical problems stabilized or resolved:  yes Denies suicidal/homicidal ideation: yes Issues/concerns per patient self-inventory:  None, feels he has no problems Other:  New problem(s) identified: none  Discharge Plan or Barriers: Will follow up with [facility]  Reason for Continuation of Hospitalization: none  Comments: Patient to discharge  Estimated length of stay: 1 day  New goal(s): To keep his follow up appointment  Review of initial/current patient goals per problem list:     Attendees: Patient:  Bradley Compton 9/27/201612:16 PM  Family:   9/27/201612:16 PM  Physician:  Dr Jennet Maduro 9/27/201612:16 PM  Nursing:   Dedra Skeens RN 9/27/201612:16 PM  Case Manager:  Arrie Senate LCSW 9/27/201612:16 PM  Counselor:  Princella Ion LRT 9/27/201612:16 PM  Other:   9/27/201612:16 PM  Other:   9/27/201612:16 PM  Other:   9/27/201612:16 PM  Other:  9/27/201612:16 PM  Other:  9/27/201612:16 PM  Other:  9/27/201612:16 PM  Other:  9/27/201612:16 PM  Other:  9/27/201612:16 PM  Other:  9/27/201612:16 PM  Other:   9/27/201612:16 PM   Scribe for Treatment Team:   Cheron Schaumann, 02/10/2015, 12:16 PM

## 2015-02-10 NOTE — Progress Notes (Signed)
LCSW met with patient and will provide handout on suicide education. 1-1 education and handout provided to patient,able to contract for safety and reports they are not suicidal or homicidal or experiencing audio or visual hallucinations. Patient will follow up with [facility] for follow up appointment.

## 2015-02-10 NOTE — [facility] Suicide Risk Assessment (Signed)
[facility] INPATIENT:  Family/Significant Other Suicide Prevention Education  Suicide Prevention Education: 1-1 education and handout provided to patient,able to contract for safety and reports they are not suicidal or homicidal or experiencing audio or visual hallucinations. Patient Refusal for Family/Significant Other Suicide Prevention Education: The patient Fransico Sciandra has refused to provide written consent for family/significant other to be provided Family/Significant Other Suicide Prevention Education during admission and/or prior to discharge.  Physician notified.  Arrie Senate M 02/10/2015, 12:09 PM

## 2015-02-10 NOTE — Progress Notes (Signed)
Patient discharged home with girlfriend. Instructions reviewed. Patient verbalized understanding of meds and follow up appointment. Belongings returned. Denies SI/HI/AVH.

## 2015-02-10 NOTE — Progress Notes (Signed)
  [facility] Adult Case Management Discharge Plan :  Will you be returning to the same living situation after discharge:  yes At discharge, do you have transportation home?: yes Do you have the ability to pay for your medications: yes  Release of information consent forms completed and in the chart;  Patient's signature needed at discharge.  Patient to Follow up at: Follow-up Information    Follow up with [facility]. Go in 1 week.   Why:  Please access the walk in clinic Mon-Friday 8-4pm for follow up discharge appointment   Contact information:   [address] [place] Phone #[phone] fax [phone]      Patient denies SI/HI: yes    Safety Planning and Suicide Prevention discussed: 1-1 education and handout provided to patient,able to contract for safety and reports they are not suicidal or homicidal or experiencing audio or visual hallucinations.      Has patient been referred to the Quitline?: Patient refused referral  Cheron Schaumann 02/10/2015, 12:11 PM

## 2015-02-11 LAB — ANA COMPREHENSIVE PANEL
Chromatin Ab SerPl-aCnc: 0.5 AI (ref 0.0–0.9)
DS DNA AB: 2 [IU]/mL (ref 0–9)
ENA SM Ab Ser-aCnc: <0.2 AI (ref 0.0–0.9)
Ribonucleic Protein: <0.2 AI (ref 0.0–0.9)
SSA (Ro) (ENA) Antibody, IgG: <0.2 AI (ref 0.0–0.9)
SSB (La) (ENA) Antibody, IgG: <0.2 AI (ref 0.0–0.9)
Scleroderma (Scl-70) (ENA) Antibody, IgG: <0.2 AI (ref 0.0–0.9)

## 2016-10-09 IMAGING — US US ABDOMEN COMPLETE
1 series · 13 of 25 positions shown · non-contrast
Comparison: None.

CLINICAL DATA: Abdominal pain 404.O (GCU-CD-CM)
LFT elevation TZX.3X (GCU-CD-CM)
Alcoholism F7I.QI (GCU-CD-CM)

EXAM:
ULTRASOUND ABDOMEN COMPLETE

[Series 1: us abdomen complete · 0.23mm/px · 13 of 42 slices shown]
[im 1/42]
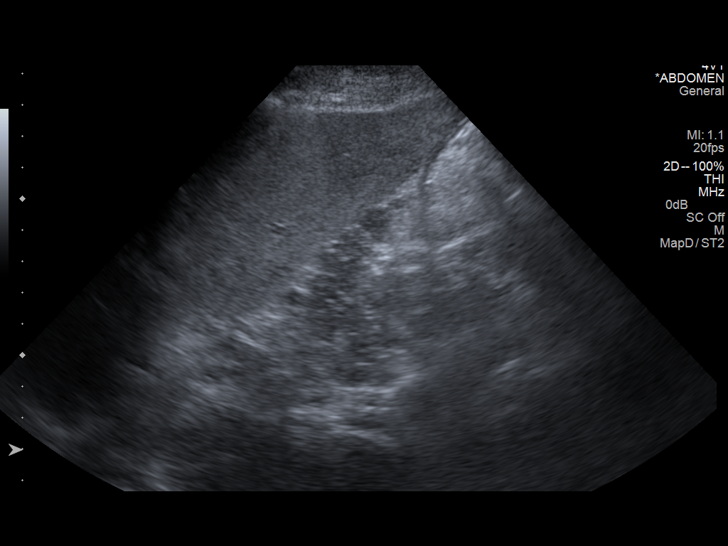
[im 4/42]
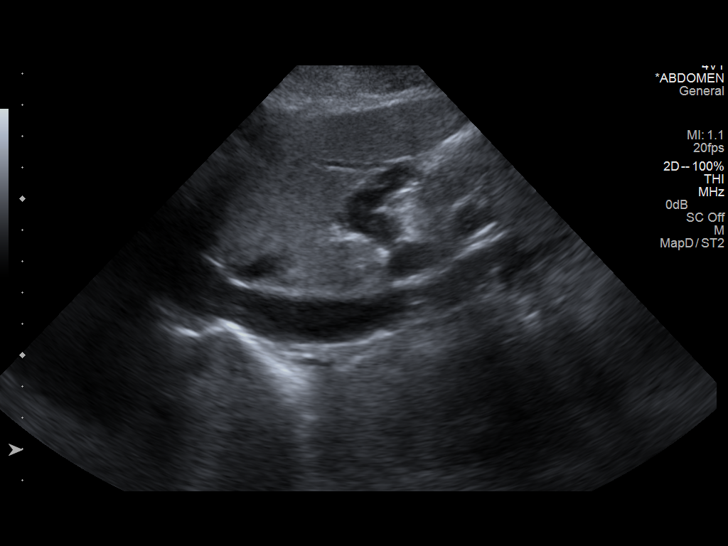
[im 7/42]
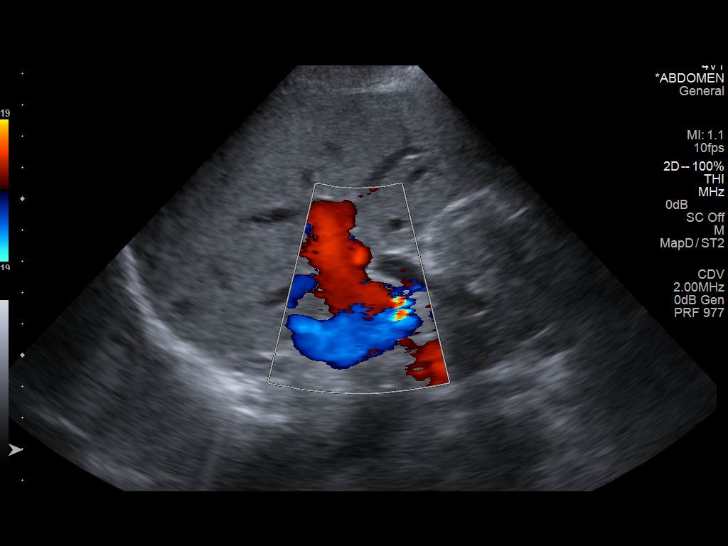
[im 11/42]
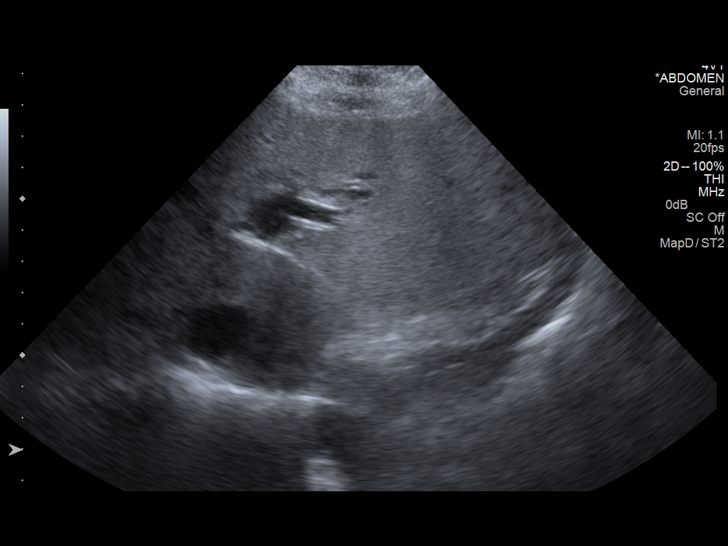
[im 14/42]
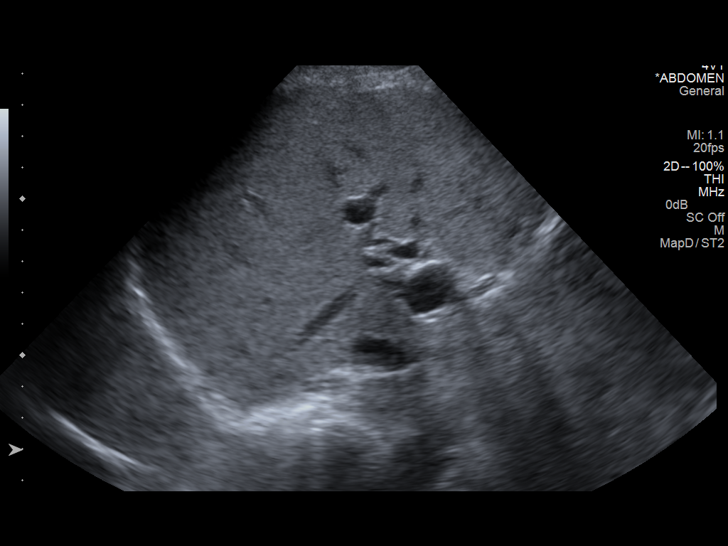
[im 18/42]
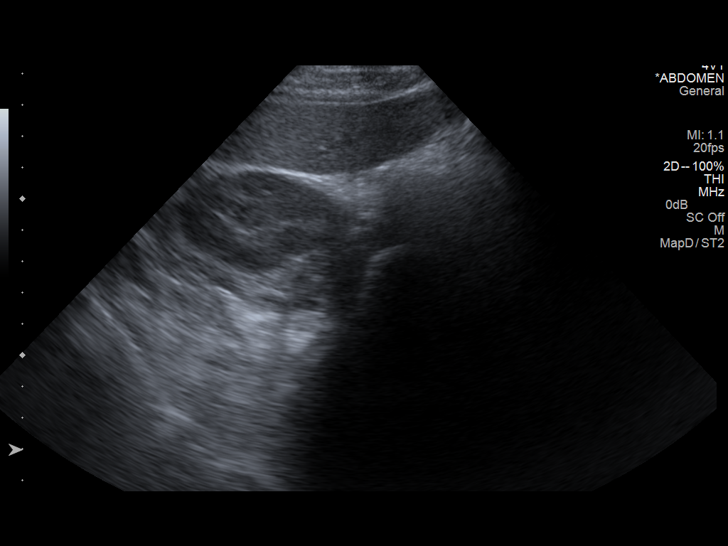
[im 21/42]
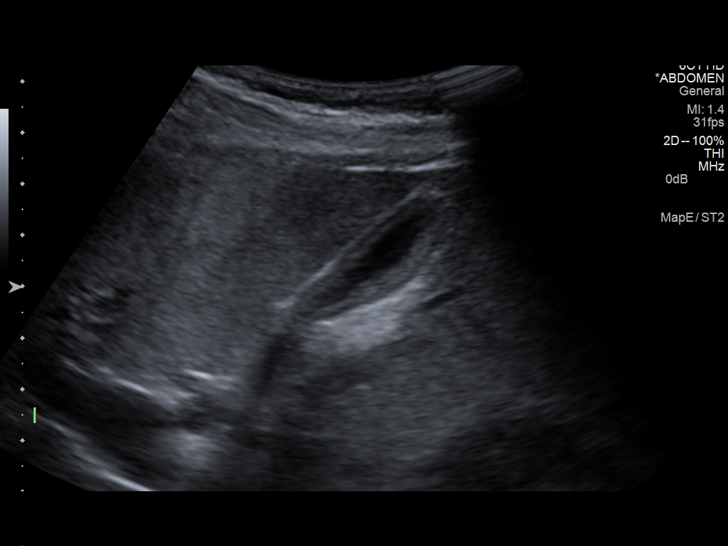
[im 24/42]
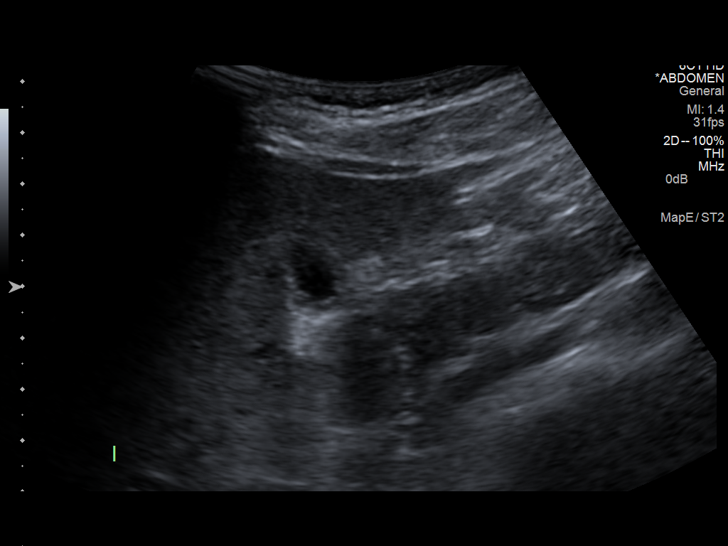
[im 28/42]
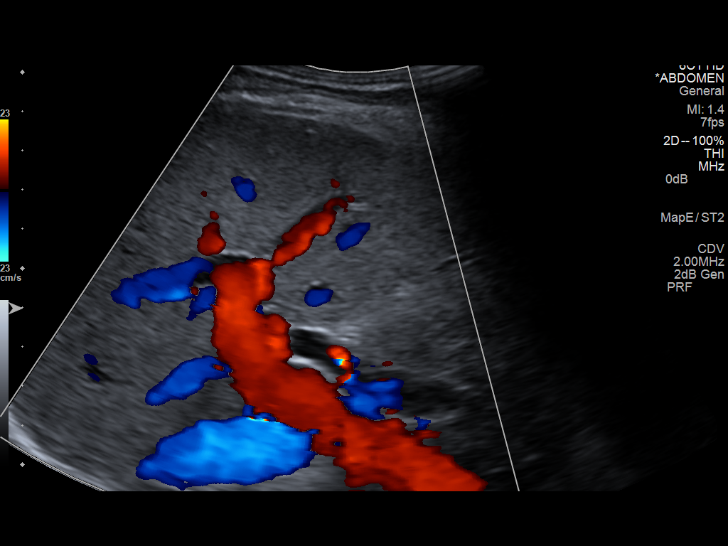
[im 31/42]
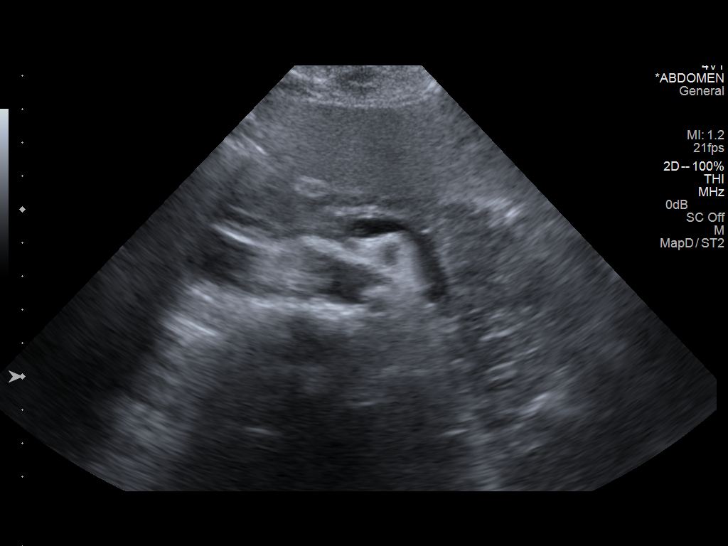
[im 35/42]
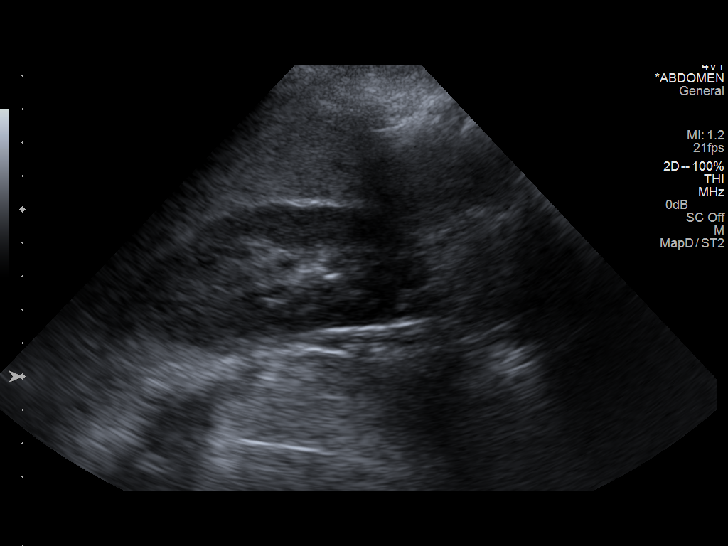
[im 38/42]
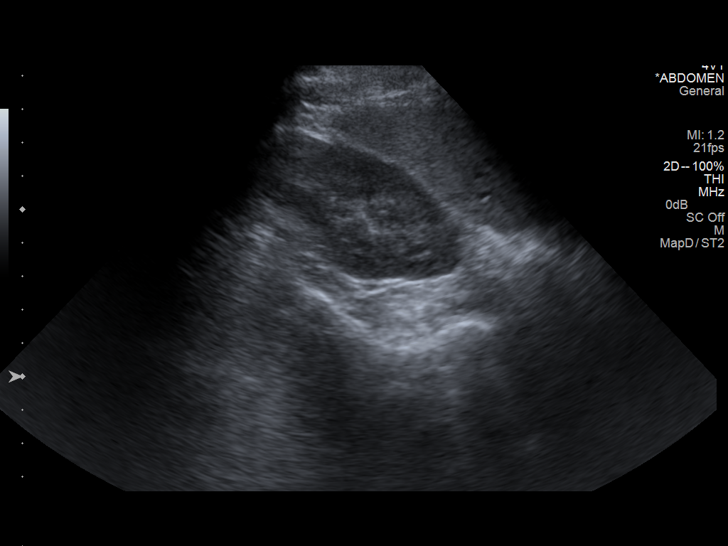
[im 42/42]
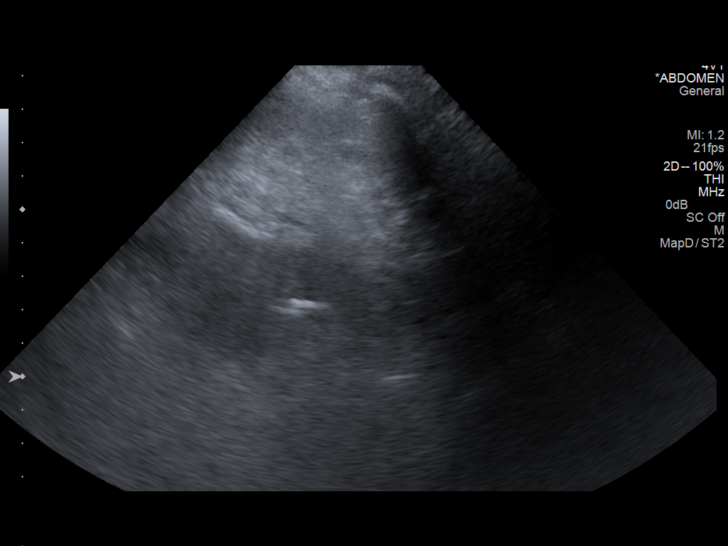

[13 of 25 positions shown; findings below may reference images not displayed]

FINDINGS: Gallbladder: Contracted gallbladder. No stones or sludge identified.
Gallbladder wall measures 2 mm, normal. No sonographic Murphy sign.

Common bile duct: Diameter: 9 mm, dilated for age. No visible common
bile duct stone. The proximal and distal portions of the common bile
duct appear normal diameter, suggesting a choledochal cyst
accounting for over the prominence.

Liver: Echogenic, compatible with hepatosteatosis. No intrahepatic
biliary ductal dilation.

IVC: No abnormality visualized.

Pancreas: Visualized portion unremarkable.

Spleen: Size and appearance within normal limits.

Right Kidney: Length: 10.7 cm. Echogenicity within normal limits. No
mass or hydronephrosis visualized.

Left Kidney:  Nephrectomy.

Abdominal aorta: No aneurysm visualized.

Other findings: None.
IMPRESSION: 1. Dilation of the middle portion of the common bile duct in 9 mm.
Within normal diameter the proximal and distal Ends of the duct and
no intrahepatic biliary ductal dilation, this probably represents a
choledochal cyst. MRCP in follow-up could confirm. Non-emergent MRI
should be deferred until patient has been discharged for the acute
illness, and can optimally cooperate with positioning and
breath-holding instructions.
2. LEFT nephrectomy.
3. Echogenic liver compatible with hepatosteatosis.

## 2017-09-17 ENCOUNTER — Other Ambulatory Visit: Payer: Self-pay

## 2017-09-17 ENCOUNTER — Encounter (HOSPITAL_COMMUNITY): Payer: Self-pay | Admitting: Emergency Medicine

## 2017-09-17 ENCOUNTER — Inpatient Hospital Stay (HOSPITAL_COMMUNITY)
Admission: EM | Admit: 2017-09-17 | Discharge: 2017-09-19 | DRG: 246 | Disposition: A | Discharge status: Home / Self Care | Payer: Self-pay | Attending: Cardiovascular Disease | Admitting: Cardiovascular Disease

## 2017-09-17 ENCOUNTER — Inpatient Hospital Stay (HOSPITAL_COMMUNITY)
Admission: EM | Disposition: A | Discharge status: Home / Self Care | Payer: Self-pay | Source: Home / Self Care | Attending: Cardiovascular Disease

## 2017-09-17 ENCOUNTER — Emergency Department (HOSPITAL_COMMUNITY): Payer: Self-pay

## 2017-09-17 DIAGNOSIS — F1020 Alcohol dependence, uncomplicated: Secondary | ICD-10-CM | POA: Diagnosis present

## 2017-09-17 DIAGNOSIS — Z79899 Other long term (current) drug therapy: Secondary | ICD-10-CM

## 2017-09-17 DIAGNOSIS — Z599 Problem related to housing and economic circumstances, unspecified: Secondary | ICD-10-CM

## 2017-09-17 DIAGNOSIS — Z7902 Long term (current) use of antithrombotics/antiplatelets: Secondary | ICD-10-CM

## 2017-09-17 DIAGNOSIS — F17200 Nicotine dependence, unspecified, uncomplicated: Secondary | ICD-10-CM | POA: Diagnosis present

## 2017-09-17 DIAGNOSIS — I2510 Atherosclerotic heart disease of native coronary artery without angina pectoris: Secondary | ICD-10-CM | POA: Diagnosis present

## 2017-09-17 DIAGNOSIS — Z8249 Family history of ischemic heart disease and other diseases of the circulatory system: Secondary | ICD-10-CM

## 2017-09-17 DIAGNOSIS — I213 ST elevation (STEMI) myocardial infarction of unspecified site: Secondary | ICD-10-CM

## 2017-09-17 DIAGNOSIS — I2111 ST elevation (STEMI) myocardial infarction involving right coronary artery: Principal | ICD-10-CM | POA: Diagnosis present

## 2017-09-17 DIAGNOSIS — I255 Ischemic cardiomyopathy: Secondary | ICD-10-CM | POA: Diagnosis present

## 2017-09-17 DIAGNOSIS — I252 Old myocardial infarction: Secondary | ICD-10-CM

## 2017-09-17 DIAGNOSIS — F17210 Nicotine dependence, cigarettes, uncomplicated: Secondary | ICD-10-CM | POA: Diagnosis present

## 2017-09-17 DIAGNOSIS — Z7982 Long term (current) use of aspirin: Secondary | ICD-10-CM

## 2017-09-17 DIAGNOSIS — Z91048 Other nonmedicinal substance allergy status: Secondary | ICD-10-CM

## 2017-09-17 DIAGNOSIS — I110 Hypertensive heart disease with heart failure: Secondary | ICD-10-CM | POA: Diagnosis present

## 2017-09-17 DIAGNOSIS — Z9119 Patient's noncompliance with other medical treatment and regimen: Secondary | ICD-10-CM

## 2017-09-17 DIAGNOSIS — I5021 Acute systolic (congestive) heart failure: Secondary | ICD-10-CM | POA: Diagnosis present

## 2017-09-17 DIAGNOSIS — E785 Hyperlipidemia, unspecified: Secondary | ICD-10-CM | POA: Diagnosis present

## 2017-09-17 DIAGNOSIS — Z955 Presence of coronary angioplasty implant and graft: Secondary | ICD-10-CM

## 2017-09-17 DIAGNOSIS — I10 Essential (primary) hypertension: Secondary | ICD-10-CM | POA: Diagnosis present

## 2017-09-17 DIAGNOSIS — Z905 Acquired absence of kidney: Secondary | ICD-10-CM

## 2017-09-17 HISTORY — PX: LEFT HEART CATH AND CORONARY ANGIOGRAPHY: CATH118249

## 2017-09-17 HISTORY — PX: CORONARY/GRAFT ACUTE MI REVASCULARIZATION: CATH118305

## 2017-09-17 LAB — LIPID PANEL
Cholesterol: 283 mg/dL — ABNORMAL HIGH (ref 0–200)
HDL: 38 mg/dL — ABNORMAL LOW (ref >40)
LDL Cholesterol: 197 mg/dL — ABNORMAL HIGH (ref 0–99)
Total CHOL/HDL Ratio: 7.4 RATIO
Triglycerides: 239 mg/dL — ABNORMAL HIGH (ref <150)
VLDL: 48 mg/dL — ABNORMAL HIGH (ref 0–40)

## 2017-09-17 LAB — TROPONIN I
TROPONIN I: 3.52 ng/mL — AB (ref <0.03)
Troponin I: 0.08 ng/mL (ref <0.03)

## 2017-09-17 LAB — CBC WITH DIFFERENTIAL/PLATELET
Basophils Absolute: 0.1 10*3/uL (ref 0.0–0.1)
Basophils Relative: 1 %
Eosinophils Absolute: 0.4 10*3/uL (ref 0.0–0.7)
Eosinophils Relative: 6 %
HCT: 41.8 % (ref 39.0–52.0)
Hemoglobin: 13.9 g/dL (ref 13.0–17.0)
Lymphocytes Relative: 32 %
Lymphs Abs: 2.5 10*3/uL (ref 0.7–4.0)
MCH: 34.4 pg — ABNORMAL HIGH (ref 26.0–34.0)
MCHC: 33.3 g/dL (ref 30.0–36.0)
MCV: 103.5 fL — ABNORMAL HIGH (ref 78.0–100.0)
Monocytes Absolute: 0.6 10*3/uL (ref 0.1–1.0)
Monocytes Relative: 7 %
Neutro Abs: 4.3 10*3/uL (ref 1.7–7.7)
Neutrophils Relative %: 54 %
Platelets: 246 10*3/uL (ref 150–400)
RBC: 4.04 MIL/uL — ABNORMAL LOW (ref 4.22–5.81)
RDW: 13.2 % (ref 11.5–15.5)
WBC: 7.9 10*3/uL (ref 4.0–10.5)

## 2017-09-17 LAB — COMPREHENSIVE METABOLIC PANEL
ALT: 11 U/L — ABNORMAL LOW (ref 17–63)
AST: 15 U/L (ref 15–41)
Albumin: 3.6 g/dL (ref 3.5–5.0)
Alkaline Phosphatase: 65 U/L (ref 38–126)
Anion gap: 11 (ref 5–15)
BUN: 6 mg/dL (ref 6–20)
CO2: 24 mmol/L (ref 22–32)
Calcium: 8.9 mg/dL (ref 8.9–10.3)
Chloride: 103 mmol/L (ref 101–111)
Creatinine, Ser: 1.05 mg/dL (ref 0.61–1.24)
GFR calc Af Amer: >60 mL/min (ref >60)
GFR calc non Af Amer: >60 mL/min (ref >60)
Glucose, Bld: 89 mg/dL (ref 65–99)
Potassium: 3.7 mmol/L (ref 3.5–5.1)
Sodium: 138 mmol/L (ref 135–145)
Total Bilirubin: 0.3 mg/dL (ref 0.3–1.2)
Total Protein: 6.5 g/dL (ref 6.5–8.1)

## 2017-09-17 LAB — MRSA PCR SCREENING: MRSA by PCR: POSITIVE — AB

## 2017-09-17 LAB — PROTIME-INR
INR: 0.96
Prothrombin Time: 12.7 seconds (ref 11.4–15.2)

## 2017-09-17 LAB — APTT: aPTT: 31 seconds (ref 24–36)

## 2017-09-17 SURGERY — CORONARY/GRAFT ACUTE MI REVASCULARIZATION
Anesthesia: LOCAL

## 2017-09-17 MED ORDER — NITROGLYCERIN 1 MG/10 ML FOR IR/CATH LAB
INTRA_ARTERIAL | Status: DC | PRN
  Administered 2017-09-17: 100 ug via INTRACORONARY

## 2017-09-17 MED ORDER — MIDAZOLAM HCL 2 MG/2ML IJ SOLN
INTRAMUSCULAR | Status: DC | PRN
  Administered 2017-09-17 (×2): 1 mg via INTRAVENOUS

## 2017-09-17 MED ORDER — ONDANSETRON HCL 4 MG/2ML IJ SOLN: 4.0000 mg | Freq: Four times a day (QID) | INTRAMUSCULAR | Status: DC | PRN

## 2017-09-17 MED ORDER — LABETALOL HCL 5 MG/ML IV SOLN: 10.0000 mg | INTRAVENOUS | Status: AC | PRN

## 2017-09-17 MED ORDER — HEPARIN (PORCINE) IN NACL 100-0.45 UNIT/ML-% IJ SOLN
700.0000 [IU]/h | INTRAMUSCULAR | Status: DC
  Administered 2017-09-17: 700 [IU]/h via INTRAVENOUS
  Filled 2017-09-17: qty 250

## 2017-09-17 MED ORDER — LIDOCAINE HCL (PF) 1 % IJ SOLN
INTRAMUSCULAR | Status: AC
  Filled 2017-09-17: qty 30

## 2017-09-17 MED ORDER — HEPARIN SODIUM (PORCINE) 5000 UNIT/ML IJ SOLN: 4000.0000 [IU] | Freq: Once | INTRAMUSCULAR | Status: DC

## 2017-09-17 MED ORDER — TICAGRELOR 90 MG PO TABS
ORAL_TABLET | ORAL | Status: DC | PRN
  Administered 2017-09-17: 180 mg via ORAL

## 2017-09-17 MED ORDER — TIROFIBAN HCL IN NACL 5-0.9 MG/100ML-% IV SOLN
INTRAVENOUS | Status: AC
  Filled 2017-09-17: qty 100

## 2017-09-17 MED ORDER — HEPARIN SODIUM (PORCINE) 1000 UNIT/ML IJ SOLN
INTRAMUSCULAR | Status: DC | PRN
  Administered 2017-09-17: 4000 [IU] via INTRAVENOUS

## 2017-09-17 MED ORDER — VERAPAMIL HCL 2.5 MG/ML IV SOLN
INTRAVENOUS | Status: AC
  Filled 2017-09-17: qty 2

## 2017-09-17 MED ORDER — ATORVASTATIN CALCIUM 80 MG PO TABS
80.0000 mg | ORAL_TABLET | Freq: Every day | ORAL | Status: DC
  Administered 2017-09-17 – 2017-09-18 (×2): 80 mg via ORAL
  Filled 2017-09-17 (×2): qty 1

## 2017-09-17 MED ORDER — MORPHINE SULFATE (PF) 2 MG/ML IV SOLN
2.0000 mg | INTRAVENOUS | Status: DC | PRN
  Administered 2017-09-17: 2 mg via INTRAVENOUS
  Filled 2017-09-17: qty 1

## 2017-09-17 MED ORDER — IOPAMIDOL (ISOVUE-370) INJECTION 76%
INTRAVENOUS | Status: DC | PRN
  Administered 2017-09-17: 105 mL via INTRA_ARTERIAL

## 2017-09-17 MED ORDER — FOLIC ACID 1 MG PO TABS
1.0000 mg | ORAL_TABLET | Freq: Every day | ORAL | Status: DC
  Administered 2017-09-17 – 2017-09-19 (×3): 1 mg via ORAL
  Filled 2017-09-17 (×2): qty 1

## 2017-09-17 MED ORDER — HEPARIN (PORCINE) IN NACL 2-0.9 UNITS/ML
INTRAMUSCULAR | Status: AC | PRN
  Administered 2017-09-17 (×2): 500 mL

## 2017-09-17 MED ORDER — NITROGLYCERIN 0.4 MG SL SUBL: 0.4000 mg | SUBLINGUAL_TABLET | SUBLINGUAL | Status: DC | PRN

## 2017-09-17 MED ORDER — THIAMINE HCL 100 MG/ML IJ SOLN
100.0000 mg | Freq: Every day | INTRAMUSCULAR | Status: DC
  Filled 2017-09-17: qty 2

## 2017-09-17 MED ORDER — TIROFIBAN (AGGRASTAT) BOLUS VIA INFUSION
INTRAVENOUS | Status: DC | PRN
  Administered 2017-09-17: 1315 ug via INTRAVENOUS

## 2017-09-17 MED ORDER — HEPARIN SODIUM (PORCINE) 1000 UNIT/ML IJ SOLN
INTRAMUSCULAR | Status: AC
  Filled 2017-09-17: qty 1

## 2017-09-17 MED ORDER — LORAZEPAM 2 MG/ML IJ SOLN: 1.0000 mg | Freq: Four times a day (QID) | INTRAMUSCULAR | Status: DC | PRN

## 2017-09-17 MED ORDER — LORAZEPAM 1 MG PO TABS: 1.0000 mg | ORAL_TABLET | Freq: Four times a day (QID) | ORAL | Status: DC | PRN

## 2017-09-17 MED ORDER — FENTANYL CITRATE (PF) 100 MCG/2ML IJ SOLN
INTRAMUSCULAR | Status: DC | PRN
  Administered 2017-09-17 (×2): 25 ug via INTRAVENOUS
  Administered 2017-09-17: 50 ug via INTRAVENOUS

## 2017-09-17 MED ORDER — FENTANYL CITRATE (PF) 100 MCG/2ML IJ SOLN
INTRAMUSCULAR | Status: AC
  Filled 2017-09-17: qty 2

## 2017-09-17 MED ORDER — CARVEDILOL 3.125 MG PO TABS
3.1250 mg | ORAL_TABLET | Freq: Two times a day (BID) | ORAL | Status: DC
  Administered 2017-09-17 – 2017-09-19 (×4): 3.125 mg via ORAL
  Filled 2017-09-17 (×3): qty 1

## 2017-09-17 MED ORDER — VERAPAMIL HCL 2.5 MG/ML IV SOLN
INTRAVENOUS | Status: DC | PRN
  Administered 2017-09-17: 16:00:00 via INTRA_ARTERIAL

## 2017-09-17 MED ORDER — TICAGRELOR 90 MG PO TABS
ORAL_TABLET | ORAL | Status: AC
  Filled 2017-09-17: qty 2

## 2017-09-17 MED ORDER — SODIUM CHLORIDE 0.9 % IV SOLN: 250.0000 mL | INTRAVENOUS | Status: DC | PRN

## 2017-09-17 MED ORDER — HEPARIN (PORCINE) IN NACL 1000-0.9 UT/500ML-% IV SOLN
INTRAVENOUS | Status: AC
  Filled 2017-09-17: qty 1000

## 2017-09-17 MED ORDER — SODIUM CHLORIDE 0.9% FLUSH
3.0000 mL | Freq: Two times a day (BID) | INTRAVENOUS | Status: DC
  Administered 2017-09-18 – 2017-09-19 (×3): 3 mL via INTRAVENOUS

## 2017-09-17 MED ORDER — HEPARIN BOLUS VIA INFUSION
3000.0000 [IU] | Freq: Once | INTRAVENOUS | Status: AC
  Administered 2017-09-17: 3000 [IU] via INTRAVENOUS
  Filled 2017-09-17: qty 3000

## 2017-09-17 MED ORDER — TIROFIBAN HCL IN NACL 5-0.9 MG/100ML-% IV SOLN: INTRAVENOUS | Status: DC | PRN

## 2017-09-17 MED ORDER — ACETAMINOPHEN 325 MG PO TABS
650.0000 mg | ORAL_TABLET | ORAL | Status: DC | PRN
  Administered 2017-09-17: 650 mg via ORAL
  Filled 2017-09-17: qty 2

## 2017-09-17 MED ORDER — ASPIRIN EC 81 MG PO TBEC
81.0000 mg | DELAYED_RELEASE_TABLET | Freq: Every day | ORAL | Status: DC
  Administered 2017-09-18 – 2017-09-19 (×2): 81 mg via ORAL
  Filled 2017-09-17 (×2): qty 1

## 2017-09-17 MED ORDER — ADULT MULTIVITAMIN W/MINERALS CH
1.0000 | ORAL_TABLET | Freq: Every day | ORAL | Status: DC
  Administered 2017-09-17 – 2017-09-19 (×3): 1 via ORAL
  Filled 2017-09-17 (×2): qty 1

## 2017-09-17 MED ORDER — IOPAMIDOL (ISOVUE-370) INJECTION 76%
INTRAVENOUS | Status: AC
  Filled 2017-09-17: qty 125

## 2017-09-17 MED ORDER — HEPARIN SODIUM (PORCINE) 1000 UNIT/ML IJ SOLN
3000.0000 [IU] | Freq: Once | INTRAMUSCULAR | Status: DC
  Filled 2017-09-17: qty 3

## 2017-09-17 MED ORDER — HEPARIN SODIUM (PORCINE) 5000 UNIT/ML IJ SOLN
5000.0000 [IU] | Freq: Three times a day (TID) | INTRAMUSCULAR | Status: DC
  Administered 2017-09-18 – 2017-09-19 (×5): 5000 [IU] via SUBCUTANEOUS
  Filled 2017-09-17 (×5): qty 1

## 2017-09-17 MED ORDER — SODIUM CHLORIDE 0.9 % IV SOLN: INTRAVENOUS | Status: AC

## 2017-09-17 MED ORDER — SODIUM CHLORIDE 0.9% FLUSH: 3.0000 mL | INTRAVENOUS | Status: DC | PRN

## 2017-09-17 MED ORDER — TICAGRELOR 90 MG PO TABS
90.0000 mg | ORAL_TABLET | Freq: Two times a day (BID) | ORAL | Status: DC
  Administered 2017-09-17 – 2017-09-19 (×4): 90 mg via ORAL
  Filled 2017-09-17 (×4): qty 1

## 2017-09-17 MED ORDER — VITAMIN B-1 100 MG PO TABS
100.0000 mg | ORAL_TABLET | Freq: Every day | ORAL | Status: DC
  Administered 2017-09-17 – 2017-09-19 (×3): 100 mg via ORAL
  Filled 2017-09-17 (×2): qty 1

## 2017-09-17 MED ORDER — LIDOCAINE HCL (PF) 1 % IJ SOLN
INTRAMUSCULAR | Status: DC | PRN
  Administered 2017-09-17: 2 mL

## 2017-09-17 MED ORDER — SODIUM CHLORIDE 0.9 % IV SOLN: INTRAVENOUS | Status: DC

## 2017-09-17 MED ORDER — NITROGLYCERIN 1 MG/10 ML FOR IR/CATH LAB
INTRA_ARTERIAL | Status: AC
  Filled 2017-09-17: qty 10

## 2017-09-17 MED ORDER — HYDRALAZINE HCL 20 MG/ML IJ SOLN: 5.0000 mg | INTRAMUSCULAR | Status: AC | PRN

## 2017-09-17 MED ORDER — MIDAZOLAM HCL 2 MG/2ML IJ SOLN
INTRAMUSCULAR | Status: AC
  Filled 2017-09-17: qty 2

## 2017-09-17 SURGICAL SUPPLY — 19 items
BALLN SAPPHIRE 2.5X15 (BALLOONS) ×2
BALLN SAPPHIRE ~~LOC~~ 3.5X12 (BALLOONS) ×2 IMPLANT
BALLOON SAPPHIRE 2.5X15 (BALLOONS) ×1 IMPLANT
CATH INFINITI 5 FR JL3.5 (CATHETERS) ×2 IMPLANT
CATH INFINITI 5FR ANG PIGTAIL (CATHETERS) ×2 IMPLANT
CATH INFINITI JR4 5F (CATHETERS) ×2 IMPLANT
CATH VISTA GUIDE 6FR JR4 (CATHETERS) ×2 IMPLANT
DEVICE RAD COMP TR BAND LRG (VASCULAR PRODUCTS) ×2 IMPLANT
GLIDESHEATH SLEND SS 6F .021 (SHEATH) ×2 IMPLANT
GUIDEWIRE INQWIRE 1.5J.035X260 (WIRE) ×1 IMPLANT
INQWIRE 1.5J .035X260CM (WIRE) ×2
KIT ENCORE 26 ADVANTAGE (KITS) ×2 IMPLANT
KIT HEART LEFT (KITS) ×2 IMPLANT
PACK CARDIAC CATHETERIZATION (CUSTOM PROCEDURE TRAY) ×2 IMPLANT
STENT SIERRA 3.00 X 33 MM (Permanent Stent) ×2 IMPLANT
SYR MEDRAD MARK V 150ML (SYRINGE) ×2 IMPLANT
TRANSDUCER W/STOPCOCK (MISCELLANEOUS) ×2 IMPLANT
TUBING CIL FLEX 10 FLL-RA (TUBING) ×2 IMPLANT
WIRE COUGAR XT STRL 190CM (WIRE) ×2 IMPLANT

## 2017-09-17 NOTE — Progress Notes (Signed)
Called to bedside due to bleeding at TR band site. Site level 0 with small amount of oozing. 3 cc added to Tr Band. Site cleaned, 4x4 added to catch any further bleeding. Will continue to monitor.

## 2017-09-17 NOTE — ED Provider Notes (Signed)
[facility] EMERGENCY DEPARTMENT Provider Note   CSN: [id number] Arrival date & time: 09/17/17  1516     History   Chief Complaint Chief Complaint  Patient presents with  . Code STEMI    HPI Bradley Compton is a 47 y.o. male.  HPI Developed chest pain approximate 1 hour prior to arrival.  It is in the mid left chest radiating into the left arm.  Patient has prior history of cardiac stents.  He took 325 aspirin prior to coming to the emergency department.  Patient reports he was mildly short of breath.  No associated nausea or diaphoresis.  No syncope.  Pain is much less severe than it was at onset.  Mild at this time.  Due to financial problems, the patient has not had routine follow-up with cardiology since his first MI.  He denies recent illness.  Review of systems is negative except for "sinus\allergy symptoms over the past week".  No cough or fever.  Patient denies he has been having recurrent chest pain.  Patient does smoke.  He reports no alcohol for several days (has not quantified routine daily use).  Patient denies any recent bleeding ulcers, gastrointestinal bleeding or easy bleeding.  No history of stroke or bleeding stroke.  Patient reports other medical history is for hyperlipidemia hypertension and coronary artery disease. Past Medical History:  Diagnosis Date  . Alcoholism (HCC)   . Coronary artery disease    Xience V 2.5 x 15 mid LAD x 2, RCA Xience V 3.0 x 15 x 1, Xience V 3. x 23 mm x 1, 2012. [facility]  . GI bleed   . Hyperlipidemia   . Hypertension   . Myocardial infarction (HCC)    2012  . Nephrolithiasis     Patient Active Problem List   Diagnosis Date Noted  . Severe recurrent major depression without psychotic features (HCC) 02/09/2015  . Tobacco use disorder 02/09/2015  . Major depressive disorder, recurrent, severe without psychotic features (HCC) 02/09/2015  . Cardiomyopathy, ischemic 05/02/2014  . Alcohol withdrawal (HCC)  05/02/2014  . LFT elevation   . Chest pain 05/01/2014  . Essential hypertension 05/01/2014  . Hyperlipidemia 05/01/2014  . Macrocytic anemia 05/01/2014  . Thrombocytopenia (HCC) 05/01/2014    Past Surgical History:  Procedure Laterality Date  . Chest tube placement    . NEPHRECTOMY     2013        Home Medications    Prior to Admission medications   Medication Sig Start Date End Date Taking? Authorizing Provider  aspirin EC 325 MG EC tablet Take 1 tablet (325 mg total) by mouth daily. 05/03/14   Joseph Art, DO  atorvastatin (LIPITOR) 80 MG tablet Take 1 tablet (80 mg total) by mouth daily at 6 PM. 02/10/15   Pucilowska, Jolanta B, MD  carvedilol (COREG) 12.5 MG tablet Take 1 tablet (12.5 mg total) by mouth 2 (two) times daily with a meal. 02/10/15   Pucilowska, Jolanta B, MD  clopidogrel (PLAVIX) 75 MG tablet Take 1 tablet (75 mg total) by mouth daily. 02/10/15   Pucilowska, Braulio Conte B, MD  diphenhydrAMINE (BENADRYL) 25 MG tablet Take 25 mg by mouth at bedtime as needed for sleep.    [provider]  escitalopram (LEXAPRO) 10 MG tablet Take 0.5 tablets (5 mg total) by mouth at bedtime. 02/10/15   Pucilowska, Braulio Conte B, MD  folic acid (FOLVITE) 1 MG tablet Take 1 tablet (1 mg total) by mouth daily. 05/03/14  Marlin Canary U, DO  lisinopril (PRINIVIL,ZESTRIL) 20 MG tablet Take 1 tablet (20 mg total) by mouth daily. 02/10/15   Pucilowska, Jolanta B, MD  Melatonin-Pyridoxine (MELATIN PO) Take 1 tablet by mouth at bedtime as needed (sleep).    [provider]  Multiple Vitamin (MULTIVITAMIN WITH MINERALS) TABS tablet Take 1 tablet by mouth daily.    [provider]  pantoprazole (PROTONIX) 40 MG tablet Take 1 tablet (40 mg total) by mouth 2 (two) times daily. 05/03/14   Joseph Art, DO  thiamine 100 MG tablet Take 1 tablet (100 mg total) by mouth daily. 05/03/14   Joseph Art, DO  traZODone (DESYREL) 100 MG tablet Take 1 tablet (100 mg total) by mouth  at bedtime as needed for sleep. 02/10/15   Shari Prows, MD    Family History Family History  Problem Relation Age of Onset  . Hypertension Mother   . CAD Mother 49       PCI  . CAD Maternal Uncle     Social History Social History   Tobacco Use  . Smoking status: Current Every Day Smoker  . Smokeless tobacco: Never Used  Substance Use Topics  . Alcohol use: Yes    Alcohol/week: 0.0 oz    Comment: Vodka everyday.  . Drug use: Never     Allergies   Other   Review of Systems Review of Systems 10 Systems reviewed and are negative for acute change except as noted in the HPI.   Physical Exam Updated Vital Signs BP (!) 142/95 (BP Location: Right Arm)   Pulse 67   Temp 97.8 F (36.6 C) (Oral)   Resp 18   Ht  (1.727 m)   Wt 52.6 kg (116 lb)   SpO2 100%   BMI 17.64 kg/m   Physical Exam  Constitutional: He is oriented to person, place, and time. He appears well-developed and well-nourished.  HENT:  Head: Normocephalic and atraumatic.  Mouth/Throat: Oropharynx is clear and moist.  Eyes: Pupils are equal, round, and reactive to light. EOM are normal.  Neck: Neck supple.  Cardiovascular: Normal rate, regular rhythm, normal heart sounds and intact distal pulses.  Pulmonary/Chest: Effort normal and breath sounds normal.  Abdominal: Soft. Bowel sounds are normal. He exhibits no distension. There is no tenderness.  Musculoskeletal: Normal range of motion. He exhibits no edema or tenderness.  Neurological: He is alert and oriented to person, place, and time. He has normal strength. He exhibits normal muscle tone. Coordination normal. GCS eye subscore is 4. GCS verbal subscore is 5. GCS motor subscore is 6.  Skin: Skin is warm, dry and intact.  Psychiatric: He has a normal mood and affect.     ED Treatments / Results  Labs (all labs ordered are listed, but only abnormal results are displayed) Labs Reviewed  CBC WITH DIFFERENTIAL/PLATELET  PROTIME-INR    APTT  COMPREHENSIVE METABOLIC PANEL  TROPONIN I  LIPID PANEL    EKG EKG Interpretation  Date/Time:  Sunday Sep 17 2017 15:21:51 EDT Ventricular Rate:  66 PR Interval:  174 QRS Duration: 84 QT Interval:  412 QTC Calculation: 431 R Axis:   114 Text Interpretation:  Critical Test Result: STEMI Normal sinus rhythm Left posterior fascicular block Anterior infarct , age undetermined Inferior injury pattern  ACUTE MI / STEMI  Abnormal ECG Confirmed by Raeford Razor [phone]) on 09/17/2017 3:28:19 PM   Radiology No results found.  Procedures Procedures (including critical care time) CRITICAL CARE Performed  by: Cristy Friedlander   Total critical care time: 30 minutes  Critical care time was exclusive of separately billable procedures and treating other patients.  Critical care was necessary to treat or prevent imminent or life-threatening deterioration.  Critical care was time spent personally by me on the following activities: development of treatment plan with patient and/or surrogate as well as nursing, discussions with consultants, evaluation of patient's response to treatment, examination of patient, obtaining history from patient or surrogate, ordering and performing treatments and interventions, ordering and review of laboratory studies, ordering and review of radiographic studies, pulse oximetry and re-evaluation of patient's condition. Medications Ordered in ED Medications  0.9 %  sodium chloride infusion (has no administration in time range)  heparin bolus via infusion 3,000 Units (has no administration in time range)  heparin ADULT infusion 100 units/mL (25000 units/260mL sodium chloride 0.45%) (has no administration in time range)     Initial Impression / Assessment and Plan / ED Course  I have reviewed the triage vital signs and the nursing notes.  Pertinent labs & imaging results that were available during my care of the patient were reviewed by me and considered in my  medical decision making (see chart for details).    Consult: Reviewed with cardiology Dr. Excell Seltzer will be in the emergency department for STEMI alert.  Final Clinical Impressions(s) / ED Diagnoses   Final diagnoses:  ST elevation myocardial infarction (STEMI), unspecified artery (HCC)   Patient developed chest pain 1 hour prior to arrival.  Initial EKG is consistent with ST elevation inferior MI.  Patient has known history of stents and coronary artery disease.  Due to financial and insurance problems, it sounds as though the patient has had some problems with medical compliance as well as continues to smoke.  He remains high risk for coronary artery disease complications.  At this time, he is stable.  Blood pressure and heart rate stable.  No respiratory distress.  Chest pain has now abated.  Repeat EKG shows decreased ST elevation.  Patient took 325 aspirin prior to arrival.  Heparin bolus and drip initiated.  Awaiting cardiology for definitive management.  ED Discharge Orders    None       Arby Barrette, MD 09/17/17 1550

## 2017-09-17 NOTE — ED Notes (Signed)
PAGED STEMI @ 1529

## 2017-09-17 NOTE — ED Triage Notes (Signed)
Pt st's he started having mid to left chest pain while at rest approx 1 hour ago.  Pt st's pain started in central chest then went to left side of chest radiating down left arm.  Pt took ASA  prior to coming to ED

## 2017-09-17 NOTE — Progress Notes (Signed)
ANTICOAGULATION CONSULT NOTE - Initial Consult  Pharmacy Consult for heparin  Indication: chest pain/ACS  Allergies  Allergen Reactions  . Other Itching and Rash    Evergreen trees    Patient Measurements: Height:  (172.7 cm) Weight: 116 lb (52.6 kg) IBW/kg (Calculated) : 68.4   Vital Signs: Temp: 97.8 F (36.6 C) (05/05 1528) Temp Source: Oral (05/05 1528) BP: 142/95 (05/05 1528) Pulse Rate: 67 (05/05 1528)  Labs: No results for input(s): HGB, HCT, PLT, APTT, LABPROT, INR, HEPARINUNFRC, HEPRLOWMOCWT, CREATININE, CKTOTAL, CKMB, TROPONINI in the last 72 hours.  CrCl cannot be calculated (Patient's most recent lab result is older than the maximum 21 days allowed.).   Medical History: Past Medical History:  Diagnosis Date  . Alcoholism (HCC)   . Coronary artery disease    Xience V 2.5 x 15 mid LAD x 2, RCA Xience V 3.0 x 15 x 1, Xience V 3. x 23 mm x 1, 2012. [facility]  . GI bleed   . Hyperlipidemia   . Hypertension   . Myocardial infarction (HCC)    2012  . Nephrolithiasis     Assessment: 47 yo male with CP. Pharmacy consulted to dose heparin.   Goal of Therapy:  Heparin level 0.3-0.7 units/ml Monitor platelets by anticoagulation protocol: Yes   Plan:  -Heparin bolus 3000 units IV followed by 700 units/hr (~ 14 units/kg/hr) -Heparin level in 6 hours and daily wth CBC daily  Harland German, PharmD Clinical Pharmacist 09/17/2017 3:45 PM

## 2017-09-17 NOTE — Progress Notes (Signed)
Received patient from cath lab.  Rt. Hand bluish in color.  Cath lab just released 2cc air out of TR band due to c/o hand numb and bluish in color.  Dr. Excell Seltzer at bedside aware of hand.  Good cap refill and sensation noted.  Will continue to monitor.

## 2017-09-17 NOTE — H&P (Signed)
Cardiology Admission History and Physical:   Patient ID: Bradley Compton; MRN: [id number]; DOB: June 01, 1970   Admission date: 09/17/2017  Primary Care Provider: Grayce Sessions, NP Primary Cardiologist: No primary care provider on file.   Chief Complaint:  Chest Pain  Patient Profile:   Bradley Compton is a 47 y.o. male with a history of Multivessel PCI, presenting with an inferior STEMI  History of Present Illness:   Bradley Compton has a history of coronary artery disease.  In 2012 he underwent multivessel stenting with a 2.5 x 15 mm and 2.5 x 28 mm drug-eluting stent in the LAD, 3.0 x 15 mm drug-eluting stent in the RCA, and 3.0 x 23 mm drug-eluting stent in the left circumflex.  At that time he had a large MI per his girlfriend's report. He's had no recent medical care. The patient presents with about 2 hours of substernal chest discomfort radiating to the left shoulder.  He denies nausea, vomiting, diaphoresis, or syncope.  He has no shortness of breath.  He arrived in the emergency department by private vehicle and a code STEMI is called after his EKG demonstrates inferior ST elevation. The patient has no insurance and has not had regular medical care in years. He takes no medications. He smokes cigarettes and drinks alcohol fairly heavily.    Past Medical History:  Diagnosis Date  . Alcoholism (HCC)   . Coronary artery disease    Xience V 2.5 x 15 mid LAD x 2, RCA Xience V 3.0 x 15 x 1, Xience V 3. x 23 mm x 1, 2012. [facility]  . GI bleed   . Hyperlipidemia   . Hypertension   . Myocardial infarction (HCC)    2012  . Nephrolithiasis     Past Surgical History:  Procedure Laterality Date  . Chest tube placement    . NEPHRECTOMY     2013     Medications Prior to Admission: Prior to Admission medications   Medication Sig Start Date End Date Taking? Authorizing Provider  aspirin EC 325 MG EC tablet Take 1 tablet (325 mg total) by mouth daily. 05/03/14   Joseph Art, DO  atorvastatin (LIPITOR) 80 MG tablet Take 1 tablet (80 mg total) by mouth daily at 6 PM. 02/10/15   Pucilowska, Jolanta B, MD  carvedilol (COREG) 12.5 MG tablet Take 1 tablet (12.5 mg total) by mouth 2 (two) times daily with a meal. 02/10/15   Pucilowska, Jolanta B, MD  clopidogrel (PLAVIX) 75 MG tablet Take 1 tablet (75 mg total) by mouth daily. 02/10/15   Pucilowska, Braulio Conte B, MD  diphenhydrAMINE (BENADRYL) 25 MG tablet Take 25 mg by mouth at bedtime as needed for sleep.    [provider]  escitalopram (LEXAPRO) 10 MG tablet Take 0.5 tablets (5 mg total) by mouth at bedtime. 02/10/15   Pucilowska, Braulio Conte B, MD  folic acid (FOLVITE) 1 MG tablet Take 1 tablet (1 mg total) by mouth daily. 05/03/14   Joseph Art, DO  lisinopril (PRINIVIL,ZESTRIL) 20 MG tablet Take 1 tablet (20 mg total) by mouth daily. 02/10/15   Pucilowska, Jolanta B, MD  Melatonin-Pyridoxine (MELATIN PO) Take 1 tablet by mouth at bedtime as needed (sleep).    [provider]  Multiple Vitamin (MULTIVITAMIN WITH MINERALS) TABS tablet Take 1 tablet by mouth daily.    [provider]  pantoprazole (PROTONIX) 40 MG tablet Take 1 tablet (40 mg total) by mouth 2 (two) times daily. 05/03/14  Marlin Canary U, DO  thiamine 100 MG tablet Take 1 tablet (100 mg total) by mouth daily. 05/03/14   Joseph Art, DO  traZODone (DESYREL) 100 MG tablet Take 1 tablet (100 mg total) by mouth at bedtime as needed for sleep. 02/10/15   Shari Prows, MD     Allergies:    Allergies  Allergen Reactions  . Other Itching and Rash    Evergreen trees    Social History:   Social History   Socioeconomic History  . Marital status: Unknown    Spouse name: Not on file  . Number of children: Not on file  . Years of education: Not on file  . Highest education level: Not on file  Occupational History  . Not on file  Social Needs  . Financial resource strain: Not on file  . Food insecurity:     Worry: Not on file    Inability: Not on file  . Transportation needs:    Medical: Not on file    Non-medical: Not on file  Tobacco Use  . Smoking status: Current Every Day Smoker  . Smokeless tobacco: Never Used  Substance and Sexual Activity  . Alcohol use: Yes    Alcohol/week: 0.0 oz    Comment: Vodka everyday.  . Drug use: Never  . Sexual activity: Not on file  Lifestyle  . Physical activity:    Days per week: Not on file    Minutes per session: Not on file  . Stress: Not on file  Relationships  . Social connections:    Talks on phone: Not on file    Gets together: Not on file    Attends religious service: Not on file    Active member of club or organization: Not on file    Attends meetings of clubs or organizations: Not on file    Relationship status: Not on file  . Intimate partner violence:    Fear of current or ex partner: Not on file    Emotionally abused: Not on file    Physically abused: Not on file    Forced sexual activity: Not on file  Other Topics Concern  . Not on file  Social History Narrative   Lives with girlfriend.      Family History:   The patient's family history includes CAD in his maternal uncle; CAD (age of onset: 85) in his mother; Hypertension in his mother.    ROS:  Please see the history of present illness.  All other ROS reviewed and negative.     Physical Exam/Data:   Vitals:   09/17/17 1648 09/17/17 1653 09/17/17 1658 09/17/17 1703  BP: 123/83 127/88 124/88 133/87  Pulse: 62 70 75 64  Resp: 11 (!) 23 (!) 29 20  Temp:      TempSrc:      SpO2: 100% 100% 100% 100%  Weight:      Height:       No intake or output data in the 24 hours ending 09/17/17 1714 Filed Weights   09/17/17 1537  Weight: 116 lb (52.6 kg)   Body mass index is 17.64 kg/m.  General:  Well nourished, well developed, thin, stoic male in no acute distress HEENT: normal Lymph: no adenopathy Neck: no JVD Endocrine:  No thryomegaly Vascular: No carotid  bruits Cardiac:  normal S1, S2; RRR; no murmur  Lungs:  clear to auscultation bilaterally, no wheezing, rhonchi or rales  Abd: soft, nontender, no hepatomegaly  Ext: no edema  Musculoskeletal:  No deformities, BUE and BLE strength normal and equal Skin: warm and dry  Neuro:  CNs 2-12 intact, no focal abnormalities noted Psych:  Normal affect    EKG:  The ECG that was done today was personally reviewed and demonstrates NSR with age-indeterminate anterior infarct and acute inferior STEMI pattern  Acute inferior wall STEMI Laboratory Data:  Chemistry Recent Labs  Lab 09/17/17 1526  NA 138  K 3.7  CL 103  CO2 24  GLUCOSE 89  BUN 6  CREATININE 1.05  CALCIUM 8.9  GFRNONAA >60  GFRAA >60  ANIONGAP 11    Recent Labs  Lab 09/17/17 1526  PROT 6.5  ALBUMIN 3.6  AST 15  ALT 11*  ALKPHOS 65  BILITOT 0.3   Hematology Recent Labs  Lab 09/17/17 1526  WBC 7.9  RBC 4.04*  HGB 13.9  HCT 41.8  MCV 103.5*  MCH 34.4*  MCHC 33.3  RDW 13.2  PLT 246   Cardiac Enzymes Recent Labs  Lab 09/17/17 1526  TROPONINI 0.08*   No results for input(s): TROPIPOC in the last 168 hours.  BNPNo results for input(s): BNP, PROBNP in the last 168 hours.  DDimer No results for input(s): DDIMER in the last 168 hours.  Radiology/Studies:  Dg Chest Port 1 View  Result Date: 09/17/2017 CLINICAL DATA:  Chest pain. EXAM: PORTABLE CHEST 1 VIEW COMPARISON:  Radiographs 05/01/2014. FINDINGS: 1518 hours. The heart size and mediastinal contours stable. Coronary stents are again noted. The lungs appear mildly hyperinflated but clear. There is no pleural effusion or pneumothorax. The bones appear unremarkable. Telemetry leads overlie the chest. IMPRESSION: No acute cardiopulmonary process.  Coronary artery stents. Electronically Signed   By: Carey Bullocks M.D.   On: 09/17/2017 16:03    Assessment and Plan:   1. Acute inferior wall STEMI 2. Tobacco abuse 3. Alcohol abuse 4. Known CAD, multivessel  stenting, and history of large MI 5. Hyperlipidemia, off of medication 6.  7. The patient has an extensive cardiac history.  He has been noncompliant as outlined above.  He will be taken for emergency cardiac catheterization and possible angioplasty/stenting.  Emergency implied consent is obtained.  Pending cardiac catheterization findings, anticipate treating him with dual antiplatelet therapy, high intensity statin drug, and medical therapy pending evaluation of his LV function.  Severity of Illness: The appropriate patient status for this patient is INPATIENT. Inpatient status is judged to be reasonable and necessary in order to provide the required intensity of service to ensure the patient's safety. The patient's presenting symptoms, physical exam findings, and initial radiographic and laboratory data in the context of their chronic comorbidities is felt to place them at high risk for further clinical deterioration. Furthermore, it is not anticipated that the patient will be medically stable for discharge from the hospital within 2 midnights of admission. The following factors support the patient status of inpatient.   " The patient's presenting symptoms include chest pain. " The initial radiographic and laboratory data are worrisome because of STEMI pattern. " The chronic co-morbidities include tobacco, alcohol, hyperlipidemia, known CAD.   * I certify that at the point of admission it is my clinical judgment that the patient will require inpatient hospital care spanning beyond 2 midnights from the point of admission due to high intensity of service, high risk for further deterioration and high frequency of surveillance required.*    For questions or updates, please contact [facility] Please consult [email or web address] for contact info under Cardiology/STEMI.  Signed, Tonny Bollman, MD  09/17/2017 5:14 PM

## 2017-09-18 ENCOUNTER — Inpatient Hospital Stay (HOSPITAL_COMMUNITY): Payer: Self-pay

## 2017-09-18 ENCOUNTER — Encounter (HOSPITAL_COMMUNITY): Payer: Self-pay | Admitting: Cardiovascular Disease

## 2017-09-18 DIAGNOSIS — I255 Ischemic cardiomyopathy: Secondary | ICD-10-CM

## 2017-09-18 DIAGNOSIS — I2111 ST elevation (STEMI) myocardial infarction involving right coronary artery: Secondary | ICD-10-CM

## 2017-09-18 DIAGNOSIS — I2510 Atherosclerotic heart disease of native coronary artery without angina pectoris: Secondary | ICD-10-CM

## 2017-09-18 DIAGNOSIS — E782 Mixed hyperlipidemia: Secondary | ICD-10-CM

## 2017-09-18 LAB — BASIC METABOLIC PANEL
ANION GAP: 7 (ref 5–15)
BUN: 7 mg/dL (ref 6–20)
CHLORIDE: 106 mmol/L (ref 101–111)
CO2: 25 mmol/L (ref 22–32)
Calcium: 9.1 mg/dL (ref 8.9–10.3)
Creatinine, Ser: 1.08 mg/dL (ref 0.61–1.24)
Glucose, Bld: 82 mg/dL (ref 65–99)
POTASSIUM: 4.6 mmol/L (ref 3.5–5.1)
SODIUM: 138 mmol/L (ref 135–145)

## 2017-09-18 LAB — POCT I-STAT, CHEM 8
BUN: 5 mg/dL — ABNORMAL LOW (ref 6–20)
CALCIUM ION: 1.27 mmol/L (ref 1.15–1.40)
CHLORIDE: 103 mmol/L (ref 101–111)
CREATININE: 0.8 mg/dL (ref 0.61–1.24)
GLUCOSE: 107 mg/dL — AB (ref 65–99)
HCT: 40 % (ref 39.0–52.0)
Hemoglobin: 13.6 g/dL (ref 13.0–17.0)
Potassium: 3.5 mmol/L (ref 3.5–5.1)
Sodium: 140 mmol/L (ref 135–145)
TCO2: 23 mmol/L (ref 22–32)

## 2017-09-18 LAB — LIPID PANEL
CHOLESTEROL: 271 mg/dL — AB (ref 0–200)
HDL: 35 mg/dL — AB (ref >40)
LDL Cholesterol: 197 mg/dL — ABNORMAL HIGH (ref 0–99)
TRIGLYCERIDES: 194 mg/dL — AB (ref <150)
Total CHOL/HDL Ratio: 7.7 RATIO
VLDL: 39 mg/dL (ref 0–40)

## 2017-09-18 LAB — ECHOCARDIOGRAM COMPLETE
Height: 68 in
Weight: 1869.5 oz

## 2017-09-18 LAB — TROPONIN I
TROPONIN I: 15.04 ng/mL — AB (ref <0.03)
TROPONIN I: 25.3 ng/mL — AB (ref <0.03)

## 2017-09-18 LAB — HIV ANTIBODY (ROUTINE TESTING W REFLEX): HIV SCREEN 4TH GENERATION: NONREACTIVE

## 2017-09-18 LAB — POCT ACTIVATED CLOTTING TIME
ACTIVATED CLOTTING TIME: 257 s
Activated Clotting Time: 285 seconds

## 2017-09-18 LAB — CBC
HCT: 40.8 % (ref 39.0–52.0)
Hemoglobin: 13.2 g/dL (ref 13.0–17.0)
MCH: 33.8 pg (ref 26.0–34.0)
MCHC: 32.4 g/dL (ref 30.0–36.0)
MCV: 104.3 fL — ABNORMAL HIGH (ref 78.0–100.0)
PLATELETS: 245 10*3/uL (ref 150–400)
RBC: 3.91 MIL/uL — ABNORMAL LOW (ref 4.22–5.81)
RDW: 13.5 % (ref 11.5–15.5)
WBC: 7.5 10*3/uL (ref 4.0–10.5)

## 2017-09-18 MED ORDER — MUPIROCIN 2 % EX OINT
TOPICAL_OINTMENT | Freq: Two times a day (BID) | CUTANEOUS | Status: DC
  Administered 2017-09-18: 11:00:00 via NASAL
  Administered 2017-09-18 – 2017-09-19 (×2): 1 via NASAL
  Filled 2017-09-18: qty 22

## 2017-09-18 MED ORDER — ESCITALOPRAM OXALATE 10 MG PO TABS
5.0000 mg | ORAL_TABLET | Freq: Every day | ORAL | Status: DC
  Administered 2017-09-18: 5 mg via ORAL
  Filled 2017-09-18: qty 1

## 2017-09-18 MED FILL — Tirofiban HCl in NaCl 0.9% IV Soln 5 MG/100ML (Base Equiv): INTRAVENOUS | Qty: 100 | Status: AC

## 2017-09-18 MED FILL — Heparin Sod (Porcine)-NaCl IV Soln 1000 Unit/500ML-0.9%: INTRAVENOUS | Qty: 1000 | Status: AC

## 2017-09-18 NOTE — Progress Notes (Signed)
CRITICAL VALUE ALERT  Critical Value:  TROPONIN 25.30  Date & Time Notied:  09/18/17 0128  Provider Notified: Fudim-aboro,MD  Orders Received/Actions taken:NO

## 2017-09-18 NOTE — Progress Notes (Signed)
CARDIAC REHAB PHASE I   PRE:  Rate/Rhythm: 68 SR    BP: sitting 108/60    SaO2: 98 RA  MODE:  Ambulation: 370 ft   POST:  Rate/Rhythm: 73 SR    BP: sitting 118/69     SaO2: 98 RA  Tolerated well. Sts he had slight chest tightness this am but none walking. No c/o. Ed completed with pt and girlfriend.  Pt sts he has no problem taking his meds and can take Brilinta x2 a day. He has not been able to get his meds in the past due to lack of insurance. CM notified. Pt is not interested in working on his drinking and sts he smokes when he drinks. Gave him resources to quit smoking. He cares for his mother which sounds like it brings him stress. Will refer to [place] [facility]. He does not drive but his girlfriend is supportive.  [id number]  Harriet Masson CES, ACSM 09/18/2017 3:00 PM

## 2017-09-18 NOTE — Progress Notes (Addendum)
Progress Note  Patient Name: Bradley Compton Date of Encounter: 09/18/2017  Primary Cardiologist: No primary care provider on file.   Subjective   No further chest pain like what he had before presentation.  He has been out of meds for at least a year.  He lives with his mother and he has transportation issues.   Inpatient Medications    Scheduled Meds: . aspirin EC  81 mg Oral Daily  . atorvastatin  80 mg Oral q1800  . carvedilol  3.125 mg Oral BID WC  . folic acid  1 mg Oral Daily  . heparin  5,000 Units Subcutaneous Q8H  . multivitamin with minerals  1 tablet Oral Daily  . sodium chloride flush  3 mL Intravenous Q12H  . thiamine  100 mg Oral Daily   Or  . thiamine  100 mg Intravenous Daily  . ticagrelor  90 mg Oral BID   Continuous Infusions: . sodium chloride Stopped (09/17/17 1650)  . sodium chloride     PRN Meds: sodium chloride, acetaminophen, LORazepam **OR** LORazepam, morphine injection, nitroGLYCERIN, ondansetron (ZOFRAN) IV, sodium chloride flush   Vital Signs    Vitals:   09/18/17 0400 09/18/17 0600 09/18/17 0800 09/18/17 0828  BP:  119/74 106/75   Pulse: (!) 51 (!) 53 62   Resp: Temp: 98.4 F (36.9 C)  98.9 F (37.2 C) 98.9 F (37.2 C)  TempSrc: Oral  Oral Oral  SpO2: 98% 99% 99%   Weight:  116 lb 13.5 oz (53 kg)    Height:        Intake/Output Summary (Last 24 hours) at 09/18/2017 0846 Last data filed at 09/18/2017 0800 Gross per 24 hour  Intake 925 ml  Output 950 ml  Net -25 ml   Filed Weights   09/17/17 1537 09/18/17 0600  Weight: 116 lb (52.6 kg) 116 lb 13.5 oz (53 kg)    Telemetry    NSR - Personally Reviewed  ECG    NSR, anterolateral ST changes- Personally Reviewed  Physical Exam   GEN: No acute distress.   Neck: No JVD Cardiac: RRR, no murmurs, rubs, or gallops.  Respiratory: Clear to auscultation bilaterally. GI: Soft, nontender, non-distended  MS: No edema; No deformity. 2+ right radial pulse, no  hematoma Neuro:  Nonfocal  Psych: Normal affect   Labs    Chemistry Recent Labs  Lab 09/17/17 1526 09/17/17 1633 09/18/17 0616  NA 138 140 138  K 3.7 3.5 4.6  CL 103 103 106  CO2 24  --  25  GLUCOSE 89 107* 82  BUN 6 5* 7  CREATININE 1.05 0.80 1.08  CALCIUM 8.9  --  9.1  PROT 6.5  --   --   ALBUMIN 3.6  --   --   AST 15  --   --   ALT 11*  --   --   ALKPHOS 65  --   --   BILITOT 0.3  --   --   GFRNONAA >60  --  >60  GFRAA >60  --  >60  ANIONGAP 11  --  7     Hematology Recent Labs  Lab 09/17/17 1526 09/17/17 1633 09/18/17 0616  WBC 7.9  --  7.5  RBC 4.04*  --  3.91*  HGB 13.9 13.6 13.2  HCT 41.8 40.0 40.8  MCV 103.5*  --  104.3*  MCH 34.4*  --  33.8  MCHC 33.3  --  32.4  RDW  13.2  --  13.5  PLT 246  --  245    Cardiac Enzymes Recent Labs  Lab 09/17/17 1526 09/17/17 1822 09/18/17 0007 09/18/17 0616  TROPONINI 0.08* 3.52* 25.30* 15.04*   No results for input(s): TROPIPOC in the last 168 hours.   BNPNo results for input(s): BNP, PROBNP in the last 168 hours.   DDimer No results for input(s): DDIMER in the last 168 hours.   Radiology    Dg Chest Port 1 View  Result Date: 09/17/2017 CLINICAL DATA:  Chest pain. EXAM: PORTABLE CHEST 1 VIEW COMPARISON:  Radiographs 05/01/2014. FINDINGS: 1518 hours. The heart size and mediastinal contours stable. Coronary stents are again noted. The lungs appear mildly hyperinflated but clear. There is no pleural effusion or pneumothorax. The bones appear unremarkable. Telemetry leads overlie the chest. IMPRESSION: No acute cardiopulmonary process.  Coronary artery stents. Electronically Signed   By: Carey Bullocks M.D.   On: 09/17/2017 16:03    Cardiac Studies   Cath results reviewed  Patient Profile     47 y.o. male with severe CAD, ischemic cardiomyopathy  Assessment & Plan    1) CAD: multivessel.  No sx prior to MI on Sunday.  He had been well.  For now, will plan to transfer to tele.  Stressed medical  compliance. Will need case manager consult to help with medicines.  Cardiac rehab.  Possible discharge tomorrow.  2) D/w Dr. Excell Seltzer regarding any other plans for PCI on remaining disease. Contine DAPT.  LDL well above target.  Need high dose statin.  He has not had issues with statins in the past.   3) Ischemic cardiomyopathy: echo pending.  Appears euvolemic.  COnsider Entresto when BP will tolerate.   For questions or updates, please contact [facility] Please consult [email or web address] for contact info under Cardiology/STEMI.      Signed, Lance Muss, MD  09/18/2017, 8:46 AM

## 2017-09-18 NOTE — Progress Notes (Signed)
  Echocardiogram 2 D Echocardiogram has been performed.  Bradley Compton M 09/18/2017, 3:45 PM

## 2017-09-19 ENCOUNTER — Other Ambulatory Visit: Payer: Self-pay | Admitting: Cardiology

## 2017-09-19 DIAGNOSIS — Z79899 Other long term (current) drug therapy: Secondary | ICD-10-CM

## 2017-09-19 DIAGNOSIS — F17200 Nicotine dependence, unspecified, uncomplicated: Secondary | ICD-10-CM

## 2017-09-19 LAB — CBC
HCT: 38 % — ABNORMAL LOW (ref 39.0–52.0)
HEMOGLOBIN: 12.4 g/dL — AB (ref 13.0–17.0)
MCH: 33.6 pg (ref 26.0–34.0)
MCHC: 32.6 g/dL (ref 30.0–36.0)
MCV: 103 fL — ABNORMAL HIGH (ref 78.0–100.0)
Platelets: 243 10*3/uL (ref 150–400)
RBC: 3.69 MIL/uL — AB (ref 4.22–5.81)
RDW: 13.2 % (ref 11.5–15.5)
WBC: 7.5 10*3/uL (ref 4.0–10.5)

## 2017-09-19 MED ORDER — ATORVASTATIN CALCIUM 80 MG PO TABS: 80.0000 mg | ORAL_TABLET | Freq: Every day | ORAL | 2 refills | Status: AC

## 2017-09-19 MED ORDER — CARVEDILOL 3.125 MG PO TABS: 3.1250 mg | ORAL_TABLET | Freq: Two times a day (BID) | ORAL | 2 refills | Status: AC

## 2017-09-19 MED ORDER — SACUBITRIL-VALSARTAN 24-26 MG PO TABS: 1.0000 | ORAL_TABLET | Freq: Two times a day (BID) | ORAL | 1 refills | Status: AC

## 2017-09-19 MED ORDER — NITROGLYCERIN 0.4 MG SL SUBL: 0.4000 mg | SUBLINGUAL_TABLET | SUBLINGUAL | 1 refills | Status: AC | PRN

## 2017-09-19 MED ORDER — SACUBITRIL-VALSARTAN 24-26 MG PO TABS
1.0000 | ORAL_TABLET | Freq: Two times a day (BID) | ORAL | Status: DC
  Administered 2017-09-19: 1 via ORAL
  Filled 2017-09-19 (×2): qty 1

## 2017-09-19 MED ORDER — ASPIRIN 81 MG PO TBEC: 81.0000 mg | DELAYED_RELEASE_TABLET | Freq: Every day | ORAL | Status: DC

## 2017-09-19 MED ORDER — TICAGRELOR 90 MG PO TABS: 90.0000 mg | ORAL_TABLET | Freq: Two times a day (BID) | ORAL | 2 refills | Status: AC

## 2017-09-19 NOTE — Progress Notes (Addendum)
Progress Note  Patient Name: Bradley Compton Date of Encounter: 09/19/2017  Primary Cardiologist: No primary care provider on file.   Subjective   No chest pain.  Had some mild SHortness of breath.  Not transferred due to lack of a bed.  Inpatient Medications    Scheduled Meds: . aspirin EC  81 mg Oral Daily  . atorvastatin  80 mg Oral q1800  . carvedilol  3.125 mg Oral BID WC  . escitalopram  5 mg Oral QHS  . folic acid  1 mg Oral Daily  . heparin  5,000 Units Subcutaneous Q8H  . multivitamin with minerals  1 tablet Oral Daily  . mupirocin ointment   Nasal BID  . sacubitril-valsartan  1 tablet Oral BID  . sodium chloride flush  3 mL Intravenous Q12H  . thiamine  100 mg Oral Daily   Or  . thiamine  100 mg Intravenous Daily  . ticagrelor  90 mg Oral BID   Continuous Infusions: . sodium chloride Stopped (09/17/17 1650)  . sodium chloride     PRN Meds: sodium chloride, acetaminophen, LORazepam **OR** LORazepam, morphine injection, nitroGLYCERIN, ondansetron (ZOFRAN) IV, sodium chloride flush   Vital Signs    Vitals:   09/19/17 0800 09/19/17 0900 09/19/17 1000 09/19/17 1100  BP: 108/77 116/73 117/68 104/65  Pulse:   73 65  Resp: (!) (!) 22  Temp:      TempSrc:      SpO2:   99% 99%  Weight:      Height:        Intake/Output Summary (Last 24 hours) at 09/19/2017 1131 Last data filed at 09/19/2017 0900 Gross per 24 hour  Intake 655 ml  Output 2775 ml  Net -2120 ml   Filed Weights   09/17/17 1537 09/18/17 0600  Weight: 116 lb (52.6 kg) 116 lb 13.5 oz (53 kg)    Telemetry    Normal sinus rhythm, brief episodes of nonsustained ventricular tachycardia- Personally Reviewed  ECG    Normal sinus rhythm, anterior Q waves, ST segment changes  Physical Exam   GEN: No acute distress.   Neck: No JVD Cardiac: RRR, no murmurs, rubs, or gallops.  Respiratory: Clear to auscultation bilaterally. GI: Soft, nontender, non-distended  MS: No edema; No  deformity. Neuro:  Nonfocal  Psych: Normal affect   Labs    Chemistry Recent Labs  Lab 09/17/17 1526 09/17/17 1633 09/18/17 0616  NA 138 140 138  K 3.7 3.5 4.6  CL 103 103 106  CO2 24  --  25  GLUCOSE 89 107* 82  BUN 6 5* 7  CREATININE 1.05 0.80 1.08  CALCIUM 8.9  --  9.1  PROT 6.5  --   --   ALBUMIN 3.6  --   --   AST 15  --   --   ALT 11*  --   --   ALKPHOS 65  --   --   BILITOT 0.3  --   --   GFRNONAA >60  --  >60  GFRAA >60  --  >60  ANIONGAP 11  --  7     Hematology Recent Labs  Lab 09/17/17 1526 09/17/17 1633 09/18/17 0616 09/19/17 0251  WBC 7.9  --  7.5 7.5  RBC 4.04*  --  3.91* 3.69*  HGB 13.9 13.6 13.2 12.4*  HCT 41.8 40.0 40.8 38.0*  MCV 103.5*  --  104.3* 103.0*  MCH 34.4*  --  33.8 33.6  MCHC  33.3  --  32.4 32.6  RDW 13.2  --  13.5 13.2  PLT 246  --  245 243    Cardiac Enzymes Recent Labs  Lab 09/17/17 1526 09/17/17 1822 09/18/17 0007 09/18/17 0616  TROPONINI 0.08* 3.52* 25.30* 15.04*   No results for input(s): TROPIPOC in the last 168 hours.   BNPNo results for input(s): BNP, PROBNP in the last 168 hours.   DDimer No results for input(s): DDIMER in the last 168 hours.   Radiology    Dg Chest Port 1 View  Result Date: 09/17/2017 CLINICAL DATA:  Chest pain. EXAM: PORTABLE CHEST 1 VIEW COMPARISON:  Radiographs 05/01/2014. FINDINGS: 1518 hours. The heart size and mediastinal contours stable. Coronary stents are again noted. The lungs appear mildly hyperinflated but clear. There is no pleural effusion or pneumothorax. The bones appear unremarkable. Telemetry leads overlie the chest. IMPRESSION: No acute cardiopulmonary process.  Coronary artery stents. Electronically Signed   By: Carey Bullocks M.D.   On: 09/17/2017 16:03    Cardiac Studies   Ejection fraction 30 to 35%  Patient Profile     47 y.o. male with recent inferior MI  Assessment & Plan    1) recent inferior myocardial infarction, multivessel coronary artery disease:  Continue aggressive secondary prevention including dual antiplatelet therapy.  2) acute systolic heart failure: Start Entresto at lowest dose today.  Follow renal function.  3) hyperlipidemia: High-dose statin ordered.  LDL target 70.  Case manager consult pending.  Medication compliance will be very important.  If there are issues getting brand-name medicines, will have to change Brilinta to clopidogrel, and Entresto to ARB or ACE-I.  I recommended cardiac rehab as well.  He can again transferred to telemetry today.  For questions or updates, please contact [facility] Please consult [email or web address] for contact info under Cardiology/STEMI.      Signed, Lance Muss, MD  09/19/2017, 11:31 AM

## 2017-09-19 NOTE — Discharge Instructions (Signed)
Acute Coronary Syndrome °Acute coronary syndrome (ACS) is a serious problem in which there is suddenly not enough blood and oxygen reaching the heart. ACS can result in chest pain or a heart attack. °What are the causes? °This condition may be caused by: °· A buildup of fat and cholesterol inside of the arteries (atherosclerosis). This is the most common cause. The buildup (plaque) can cause the blood vessels in your heart (coronary arteries) to become narrow or blocked. Plaque can also break off to form a clot. °· A coronary spasm. °· A tearing of the coronary artery (spontaneous coronary artery dissection). °· Low blood pressure (hypotension). °· An abnormal heart beat (arrhythmia). °· Using cocaine or methamphetamine. ° °What increases the risk? °The following factors may make you more likely to develop this condition: °· Age. °· History of chest pain, heart attack, or stroke. °· Being male. °· Family history of chest pain, heart disease, or stroke. °· Smoking. °· Inactivity. °· Being overweight. °· High cholesterol. °· High blood pressure (hypertension). °· Diabetes. °· Excessive alcohol use. ° °What are the signs or symptoms? °Common symptoms of this condition include: °· Chest pain. The pain may last long, or may stop and come back (recur). It may feel like: °? Crushing or squeezing. °? Tightness, pressure, fullness, or heaviness. °· Arm, neck, jaw, or back pain. °· Heartburn or indigestion. °· Shortness of breath. °· Nausea. °· Sudden cold sweats. °· Lightheadedness. °· Dizziness. °· Tiredness (fatigue). ° °Sometimes there are no symptoms. °How is this diagnosed? °This condition may be diagnosed through: °· An electrocardiogram (ECG). This test records the impulses of the heart. °· Blood tests. °· A CT scan of the chest. °· A coronary angiogram. This procedure checks for a blockage in the coronary arteries. ° °How is this treated? °Treatment for this condition may include: °· Oxygen. °· Medicines, such  as: °? Antiplatelet medicines and blood-thinning medicines, such as aspirin. These help prevent blood clots. °? Fibrinolytic therapy. This breaks apart a blood clot. °? Blood pressure medicines. °? Nitroglycerin. °? Pain medicine. °? Cholesterol medicine. °· A procedure called coronary angioplasty and stenting. This is done to widen a narrowed artery and keep it open. °· Coronary artery bypass surgery. This allows blood to pass the blockage to reach your heart. °· Cardiac rehabilitation. This is a program that helps improve your health and well-being. It includes exercise training, education, and counseling to help you recover. ° °Follow these instructions at home: °Eating and drinking °· Follow a heart-healthy, low-salt (sodium) diet. °· Use healthy cooking methods such as roasting, grilling, broiling, baking, poaching, steaming, or stir-frying. °· Talk to a dietitian to learn about healthy cooking methods and how to eat less sodium. °Medicines °· Take over-the-counter and prescription medicines only as told by your health care provider. °· Do not take these medicines unless your health care provider approves: °? Nonsteroidal anti-inflammatory drugs (NSAIDs), such as ibuprofen, naproxen, or celecoxib. °? Vitamin supplements that contain vitamin A or vitamin E. °? Hormone replacement therapy that contains estrogen. °Activity °· Join a cardiac rehabilitation program. °· Ask your health care provider: °? What activities and exercises are safe for you. °? If you should follow specific instructions about lifting, driving, or climbing stairs. °· If you are taking aspirin and another blood thinning medicine, avoid activities that are likely to result in an injury. The medicines can increase your risk of bleeding. °Lifestyle °· Do not use any products that contain nicotine or tobacco, such as cigarettes   and e-cigarettes. If you need help quitting, ask your health care provider. °· If you drink alcohol and your health care  provider says it is okay to drink, limit your alcohol intake to no more than 1 drink per day. One drink equals 12 oz of beer, 5 oz of wine, or 1½ oz of hard liquor. °· Maintain a healthy weight. If you need to lose weight, do it in a way that has been approved by your health care provider. °General instructions °· Tell all your health care providers about your heart condition, including your dentist. Some medicines can increase your risk of arrhythmia. °· Manage other health conditions, such as hypertension and diabetes. These conditions affect your heart. °· Learn ways to manage stress. °· Get screened for depression, and seek treatment if needed. °· Monitor your blood pressure if told by your health care provider. °· Keep your vaccinations up to date. Get the annual influenza vaccine. °· Keep all follow-up visits as told by your health care provider. This is important. °Contact a health care provider if: °· You feel overwhelmed or sad. °· You have trouble with your daily activities. °Get help right away if: °· You have pain in your chest, neck, arm, jaw, stomach, or back that recurs, and: °? Lasts more than a few minutes. °? Is not relieved by taking the medicineyour health care provider prescribed. °· You have unexplained: °? Heavy sweating. °? Heartburn or indigestion. °? Shortness of breath. °? Difficulty breathing. °? Nausea or vomiting. °? Fatigue. °? Nervousness or anxiety. °? Weakness. °? Diarrhea. °? Dark stools or blood in the stool. °· You have sudden lightheadedness or dizziness. °· Your blood pressure is higher than 180/120 °· You faint. °· You feel like hurting yourself or think about taking your own life. °These symptoms may represent a serious problem that is an emergency. Do not wait to see if the symptoms will go away. Get medical help right away. Call your local emergency services (911 in the [place] Do not drive yourself to the clinic or hospital. °Summary °· Acute coronary syndrome (ACS) is a  when there is not enough blood and oxygen being supplied to the heart. ACS can result in chest pain or a heart attack. °· Acute coronary syndrome is a medical emergency. If you have any symptoms of this condition, get help right away. °· Treatment includes oxygen, medicines, and procedures to open the blocked arteries and restore blood flow. °This information is not intended to replace advice given to you by your health care provider. Make sure you discuss any questions you have with your health care provider. °Document Released: 05/02/2005 Document Revised: 06/03/2016 Document Reviewed: 06/03/2016 °Elsevier Interactive Patient Education © 2018 [employer] Inc[employer] ° ° °Aspirin and Your Heart °Aspirin is a medicine that affects the way blood clots. Aspirin can be used to help reduce the risk of blood clots, heart attacks, and other heart-related problems. °Should I take aspirin? °Your health care provider will help you determine whether it is safe and beneficial for you to take aspirin daily. Taking aspirin daily may be beneficial if you: °· Have had a heart attack or chest pain. °· Have undergone open heart surgery such as coronary artery bypass surgery (CABG). °· Have had coronary angioplasty. °· Have experienced a stroke or transient ischemic attack (TIA). °· Have peripheral vascular disease (PVD). °· Have chronic heart rhythm problems such as atrial fibrillation. ° °Are there any risks of taking aspirin daily? °Daily use of aspirin can   increase your risk of side effects. Some of these include:  Bleeding. Bleeding problems can be minor or serious. An example of a minor problem is a cut that does not stop bleeding. An example of a more serious problem is stomach bleeding or bleeding into the brain. Your risk of bleeding is increased if you are also taking non-steroidal anti-inflammatory medicine (NSAIDs).  Increased bruising.  Upset stomach.  An allergic reaction. People who have nasal polyps have an increased  risk of developing an aspirin allergy.  What are some guidelines I should follow when taking aspirin?  Take aspirin only as directed by your health care provider. Make sure you understand how much you should take and what form you should take. The two forms of aspirin are: ? Non-enteric-coated. This type of aspirin does not have a coating and is absorbed quickly. Non-enteric-coated aspirin is usually recommended for people with chest pain. This type of aspirin also comes in a chewable form. ? Enteric-coated. This type of aspirin has a special coating that releases the medicine very slowly. Enteric-coated aspirin causes less stomach upset than non-enteric-coated aspirin. This type of aspirin should not be chewed or crushed.  Drink alcohol in moderation. Drinking alcohol increases your risk of bleeding. When should I seek medical care?  You have unusual bleeding or bruising.  You have stomach pain.  You have an allergic reaction. Symptoms of an allergic reaction include: ? Hives. ? Itchy skin. ? Swelling of the lips, tongue, or face.  You have ringing in your ears. When should I seek immediate medical care?  Your bowel movements are bloody, dark red, or black in color.  You vomit or cough up blood.  You have blood in your urine.  You cough, wheeze, or feel Bradley Compton of breath. If you have any of the following symptoms, this is an emergency. Do not wait to see if the pain will go away. Get medical help at once. Call your local emergency services (911 in the [place] Do not drive yourself to the hospital.  You have severe chest pain, especially if the pain is crushing or pressure-like and spreads to the arms, back, neck, or jaw.  You have stroke-like symptoms, such as: ? Loss of vision. ? Difficulty talking. ? Numbness or weakness on one side of your body. ? Numbness or weakness in your arm or leg. ? Not thinking clearly or feeling confused.  This information is not intended to replace  advice given to you by your health care provider. Make sure you discuss any questions you have with your health care provider. Document Released: 04/14/2008 Document Revised: 09/09/2015 Document Reviewed: 08/07/2013 Elsevier Interactive Patient Education  2018 [employer] Inc[employer]   Acute Coronary Syndrome Acute coronary syndrome (ACS) is a serious problem in which there is suddenly not enough blood and oxygen reaching the heart. ACS can result in chest pain or a heart attack. What are the causes? This condition may be caused by:  A buildup of fat and cholesterol inside of the arteries (atherosclerosis). This is the most common cause. The buildup (plaque) can cause the blood vessels in your heart (coronary arteries) to become narrow or blocked. Plaque can also break off to form a clot.  A coronary spasm.  A tearing of the coronary artery (spontaneous coronary artery dissection).  Low blood pressure (hypotension).  An abnormal heart beat (arrhythmia).  Using cocaine or methamphetamine.  What increases the risk? The following factors may make you more likely to develop this condition:  Age.  History of chest pain, heart attack, or stroke.  Being male.  Family history of chest pain, heart disease, or stroke.  Smoking.  Inactivity.  Being overweight.  High cholesterol.  High blood pressure (hypertension).  Diabetes.  Excessive alcohol use.  What are the signs or symptoms? Common symptoms of this condition include:  Chest pain. The pain may last long, or may stop and come back (recur). It may feel like: ? Crushing or squeezing. ? Tightness, pressure, fullness, or heaviness.  Arm, neck, jaw, or back pain.  Heartburn or indigestion.  Shortness of breath.  Nausea.  Sudden cold sweats.  Lightheadedness.  Dizziness.  Tiredness (fatigue).  Sometimes there are no symptoms. How is this diagnosed? This condition may be diagnosed through:  An electrocardiogram  (ECG). This test records the impulses of the heart.  Blood tests.  A CT scan of the chest.  A coronary angiogram. This procedure checks for a blockage in the coronary arteries.  How is this treated? Treatment for this condition may include:  Oxygen.  Medicines, such as: ? Antiplatelet medicines and blood-thinning medicines, such as aspirin. These help prevent blood clots. ? Fibrinolytic therapy. This breaks apart a blood clot. ? Blood pressure medicines. ? Nitroglycerin. ? Pain medicine. ? Cholesterol medicine.  A procedure called coronary angioplasty and stenting. This is done to widen a narrowed artery and keep it open.  Coronary artery bypass surgery. This allows blood to pass the blockage to reach your heart.  Cardiac rehabilitation. This is a program that helps improve your health and well-being. It includes exercise training, education, and counseling to help you recover.  Follow these instructions at home: Eating and drinking  Follow a heart-healthy, low-salt (sodium) diet.  Use healthy cooking methods such as roasting, grilling, broiling, baking, poaching, steaming, or stir-frying.  Talk to a dietitian to learn about healthy cooking methods and how to eat less sodium. Medicines  Take over-the-counter and prescription medicines only as told by your health care provider.  Do not take these medicines unless your health care provider approves: ? Nonsteroidal anti-inflammatory drugs (NSAIDs), such as ibuprofen, naproxen, or celecoxib. ? Vitamin supplements that contain vitamin A or vitamin E. ? Hormone replacement therapy that contains estrogen. Activity  Join a cardiac rehabilitation program.  Ask your health care provider: ? What activities and exercises are safe for you. ? If you should follow specific instructions about lifting, driving, or climbing stairs.  If you are taking aspirin and another blood thinning medicine, avoid activities that are likely to  result in an injury. The medicines can increase your risk of bleeding. Lifestyle  Do not use any products that contain nicotine or tobacco, such as cigarettes and e-cigarettes. If you need help quitting, ask your health care provider.  If you drink alcohol and your health care provider says it is okay to drink, limit your alcohol intake to no more than 1 drink per day. One drink equals 12 oz of beer, 5 oz of wine, or 1 oz of hard liquor.  Maintain a healthy weight. If you need to lose weight, do it in a way that has been approved by your health care provider. General instructions  Tell all your health care providers about your heart condition, including your dentist. Some medicines can increase your risk of arrhythmia.  Manage other health conditions, such as hypertension and diabetes. These conditions affect your heart.  Learn ways to manage stress.  Get screened for depression, and seek treatment if needed.  Monitor your blood pressure if told by your health care provider.  Keep your vaccinations up to date. Get the annual influenza vaccine.  Keep all follow-up visits as told by your health care provider. This is important. Contact a health care provider if:  You feel overwhelmed or sad.  You have trouble with your daily activities. Get help right away if:  You have pain in your chest, neck, arm, jaw, stomach, or back that recurs, and: ? Lasts more than a few minutes. ? Is not relieved by taking the medicineyour health care provider prescribed.  You have unexplained: ? Heavy sweating. ? Heartburn or indigestion. ? Shortness of breath. ? Difficulty breathing. ? Nausea or vomiting. ? Fatigue. ? Nervousness or anxiety. ? Weakness. ? Diarrhea. ? Dark stools or blood in the stool.  You have sudden lightheadedness or dizziness.  Your blood pressure is higher than 180/120  You faint.  You feel like hurting yourself or think about taking your own life. These symptoms  may represent a serious problem that is an emergency. Do not wait to see if the symptoms will go away. Get medical help right away. Call your local emergency services (911 in the [place] Do not drive yourself to the clinic or hospital. Summary  Acute coronary syndrome (ACS) is a when there is not enough blood and oxygen being supplied to the heart. ACS can result in chest pain or a heart attack.  Acute coronary syndrome is a medical emergency. If you have any symptoms of this condition, get help right away.  Treatment includes oxygen, medicines, and procedures to open the blocked arteries and restore blood flow. This information is not intended to replace advice given to you by your health care provider. Make sure you discuss any questions you have with your health care provider. Document Released: 05/02/2005 Document Revised: 06/03/2016 Document Reviewed: 06/03/2016 Elsevier Interactive Patient Education  2018 [employer].   Aspirin and Your Heart Aspirin is a medicine that affects the way blood clots. Aspirin can be used to help reduce the risk of blood clots, heart attacks, and other heart-related problems. Should I take aspirin? Your health care provider will help you determine whether it is safe and beneficial for you to take aspirin daily. Taking aspirin daily may be beneficial if you:  Have had a heart attack or chest pain.  Have undergone open heart surgery such as coronary artery bypass surgery (CABG).  Have had coronary angioplasty.  Have experienced a stroke or transient ischemic attack (TIA).  Have peripheral vascular disease (PVD).  Have chronic heart rhythm problems such as atrial fibrillation.  Are there any risks of taking aspirin daily? Daily use of aspirin can increase your risk of side effects. Some of these include:  Bleeding. Bleeding problems can be minor or serious. An example of a minor problem is a cut that does not stop bleeding. An example of a more  serious problem is stomach bleeding or bleeding into the brain. Your risk of bleeding is increased if you are also taking non-steroidal anti-inflammatory medicine (NSAIDs).  Increased bruising.  Upset stomach.  An allergic reaction. People who have nasal polyps have an increased risk of developing an aspirin allergy.  What are some guidelines I should follow when taking aspirin?  Take aspirin only as directed by your health care provider. Make sure you understand how much you should take and what form you should take. The two forms of aspirin are: ? Non-enteric-coated. This type of aspirin does not  have a coating and is absorbed quickly. Non-enteric-coated aspirin is usually recommended for people with chest pain. This type of aspirin also comes in a chewable form. ? Enteric-coated. This type of aspirin has a special coating that releases the medicine very slowly. Enteric-coated aspirin causes less stomach upset than non-enteric-coated aspirin. This type of aspirin should not be chewed or crushed.  Drink alcohol in moderation. Drinking alcohol increases your risk of bleeding. When should I seek medical care?  You have unusual bleeding or bruising.  You have stomach pain.  You have an allergic reaction. Symptoms of an allergic reaction include: ? Hives. ? Itchy skin. ? Swelling of the lips, tongue, or face.  You have ringing in your ears. When should I seek immediate medical care?  Your bowel movements are bloody, dark red, or black in color.  You vomit or cough up blood.  You have blood in your urine.  You cough, wheeze, or feel Bradley Compton of breath. If you have any of the following symptoms, this is an emergency. Do not wait to see if the pain will go away. Get medical help at once. Call your local emergency services (911 in the [place] Do not drive yourself to the hospital.  You have severe chest pain, especially if the pain is crushing or pressure-like and spreads to the arms,  back, neck, or jaw.  You have stroke-like symptoms, such as: ? Loss of vision. ? Difficulty talking. ? Numbness or weakness on one side of your body. ? Numbness or weakness in your arm or leg. ? Not thinking clearly or feeling confused.  This information is not intended to replace advice given to you by your health care provider. Make sure you discuss any questions you have with your health care provider. Document Released: 04/14/2008 Document Revised: 09/09/2015 Document Reviewed: 08/07/2013 Elsevier Interactive Patient Education  2018 [employer].

## 2017-09-19 NOTE — Care Management Note (Signed)
Case Management Note Donn Pierini RN, BSN Unit 4E-Case Manager- 2H coverage [phone]  Patient Details  Name: Zohan Shiflet MRN: [id number] Date of Birth: 12-28-1970  Subjective/Objective:  Pt admitted with STEMI s/p DES                   Action/Plan: PTA pt lived at home, referral from medication needs received- spoke with pt at bedside along with girlfriend with pt permission. Per discussion pt does not currently have a PCP and has transportation issues as pt does not drive- girlfriend who lives in [place] has offered to assist pt in getting to appointments. CM will try to make appointment with [facility] or [facility]- calls made yet unable to get through to make appointment- info provided to pt and girlfriend to call for f/u. Reviewed pt's medications - most are low cost which pt and girlfriend state that they can handle cost, however pt has been started on both Brilinta and Entrestro- pt provided 30 day free cards for both of these drugs along with pt assistance applications for both for pt to f/u and see if he can get approved for further assistance- pt voices understanding of need to fill out and take application to f/u appointment with cards- to process application and to call cards MD office if he is close to running out of either medication.   Expected Discharge Date:  09/20/17               Expected Discharge Plan:     In-House Referral:  NA  Discharge planning Services  CM Consult, [facility]  Post Acute Care Choice:  NA Choice offered to:  NA  DME Arranged:    DME Agency:     HH Arranged:    HH Agency:     Status of Service:  Completed, signed off  If discussed at Long Length of Stay Meetings, dates discussed:    Discharge Disposition: home/self care   Additional Comments:  Darrold Span, RN 09/19/2017, 12:42 PM

## 2017-09-19 NOTE — Progress Notes (Signed)
Came to ambulate but pt cleaning up. Discussed low sodium and daily wts with girlfriend (pt listening through curtain). Gave them HF booklet. Voiced understanding. They will walk later. He walked 740 ft last night.  [id number] Ethelda Chick CES, ACSM 2:00 PM 09/19/2017

## 2017-09-19 NOTE — Plan of Care (Signed)
  Problem: Education: Goal: Knowledge of General Education information will improve Outcome: Progressing   Problem: Health Behavior/Discharge Planning: Goal: Ability to manage health-related needs will improve Outcome: Progressing   Problem: Clinical Measurements: Goal: Ability to maintain clinical measurements within normal limits will improve Outcome: Progressing Goal: Will remain free from infection Outcome: Progressing Goal: Diagnostic test results will improve Outcome: Progressing Goal: Respiratory complications will improve Outcome: Progressing Goal: Cardiovascular complication will be avoided Outcome: Progressing   Problem: Activity: Goal: Risk for activity intolerance will decrease Outcome: Progressing   Problem: Nutrition: Goal: Adequate nutrition will be maintained Outcome: Progressing   Problem: Coping: Goal: Level of anxiety will decrease Outcome: Progressing   Problem: Elimination: Goal: Will not experience complications related to bowel motility Outcome: Progressing Goal: Will not experience complications related to urinary retention Outcome: Progressing   Problem: Pain Managment: Goal: General experience of comfort will improve Outcome: Progressing   Problem: Safety: Goal: Ability to remain free from injury will improve Outcome: Progressing   Problem: Education: Goal: Understanding of cardiac disease, CV risk reduction, and recovery process will improve Outcome: Progressing Goal: Understanding of medication regimen will improve Outcome: Progressing   Problem: Activity: Goal: Ability to tolerate increased activity will improve Outcome: Progressing   Problem: Cardiac: Goal: Ability to achieve and maintain adequate cardiopulmonary perfusion will improve Outcome: Progressing   Problem: Cardiac: Goal: Vascular access site(s) Level 0-1 will be maintained Outcome: Completed/Met

## 2017-09-19 NOTE — Discharge Summary (Addendum)
Discharge Summary    Patient ID: Bradley Compton,  MRN: [id number], DOB/AGE: [birth year] 47 y.o.  Admit date: 09/17/2017 Discharge date: 09/20/2017  Primary Care Provider: Grayce Compton Primary Cardiologist: Dr. Excell Seltzer   Discharge Diagnoses    Principal Problem:   STEMI involving right coronary artery [facility]) Active Problems:   Essential hypertension   Hyperlipidemia   Cardiomyopathy, ischemic   Tobacco use disorder   ST elevation myocardial infarction involving right coronary artery (HCC)   Allergies Allergies  Allergen Reactions  . Other Itching and Rash    Evergreen trees    Diagnostic Studies/Procedures    Cath: 09/17/17  Conclusion     Mid RCA lesion is 20% stenosed.  Prox RCA to Mid RCA lesion is 50% stenosed.  Prox RCA lesion is 95% stenosed.  A drug-eluting stent was successfully placed using a STENT SIERRA 3.00 X 33 MM.  Post intervention, there is a 0% residual stenosis.  Post intervention, there is a 0% residual stenosis.  Post intervention, there is a 0% residual stenosis.  Mid LM to Dist LM lesion is 50% stenosed.  Prox LAD to Mid LAD lesion is 25% stenosed.  Prox LAD lesion is 70% stenosed.  Previously placed Prox Cx to Mid Cx stent (unknown type) is widely patent.  Ost Cx to Prox Cx lesion is 35% stenosed.  There is severe left ventricular systolic dysfunction.  LV end diastolic pressure is mildly elevated.  The left ventricular ejection fraction is 25-35% by visual estimate.   1. Acute inferior MI secondary to critical proximal RCA stenosis, treated successfully with PCI using a 3.0x33 mm Xience Sierra DES 2. Moderate residual CAD with 50% distal LM stenosis, 60-70% proximal LAD stenosis, and mild nonobstructive LCx stenosis 3. Continued patency of the stented segments in the LAD, RCA, and LCx 4. Severe segmental LV systolic dysfunction, LVEF estimated at 30%  Recommend:  DAPT with ASA and ticagrelor minimum of 12 months  (loaded with 180 mg ticagrelor in cath lab)  Tobacco/Etoh cessation  Secondary risk reduction measures/Post-MI medical therapy  Echo to reassess LVEF    TTE: 09/18/17  Study Conclusions  - Left ventricle: The cavity size was normal. Wall thickness was   normal. Systolic function was moderately to severely reduced. The   estimated ejection fraction was in the range of 30% to 35%. There   is akinesis of the basalinferoseptal myocardium. There is   akinesis of the mid-apicalanteroseptal, anterior, anterolateral,   lateral, inferolateral, inferior, inferoseptal, and apical   myocardium. Doppler parameters are consistent with abnormal left   ventricular relaxation (grade 1 diastolic dysfunction). - Mitral valve: There was mild regurgitation. _____________   History of Present Illness     Bradley Compton has a history of coronary artery disease.  In 2012 he underwent multivessel stenting with a 2.5 x 15 mm and 2.5 x 28 mm drug-eluting stent in the LAD, 3.0 x 15 mm drug-eluting stent in the RCA, and 3.0 x 23 mm drug-eluting stent in the left circumflex.  At that time he had a large MI per his girlfriend's report. He's had no recent medical care. The patient presented with about 2 hours of substernal chest discomfort radiating to the left shoulder.  He denied nausea, vomiting, diaphoresis, or syncope.  He had no shortness of breath.  He arrived in the emergency department by private vehicle and a code STEMI was called after his EKG demonstrates inferior ST elevation. The patient reported having no insurance and had  not had regular medical care in years. He takes no medications. He smokes cigarettes and drinks alcohol fairly heavily. EKG showed acute inferior MI and he was taken directly to the cath lab.    Hospital Course     Underwent cardiac cath noted above with PCI/DES x1 to the pRCA with residual moderate CAD, 50%LM, 60-70% pLAD and mild nonobstructive LCx disease. Patent stents in the  LAD, RCA and Lcx. EF via cath noted at 30%. Planned for DAPT with ASA/Brilinta. Follow up echo showed EF of 30-35% with basalinferoseptal myocardium and G1DD. LDL 197, and Trop peaked at 25. He was placed on high dose statin as well as Entresto given his LV dysfunction. Worked well with cardiac rehab without recurrent chest pain. Has had issues with medication compliance in the past. Seen by the CM and determined he could afford medications. He was given 30 day cards for Brilinta and Entresto prior to discharge. Discussed the need for tobacco cessation as well.    Bradley Compton was seen by Dr. Eldridge Dace and determined stable for discharge home. Follow up in the office has been arranged. Medications are listed below.   _____________  Discharge Vitals Blood pressure 102/70, pulse (!) 58, temperature 97.8 F (36.6 C), temperature source Oral, resp. rate 15, height  (1.727 m), weight 116 lb 13.5 oz (53 kg), SpO2 97 %.  Filed Weights   09/17/17 1537 09/18/17 0600  Weight: 116 lb (52.6 kg) 116 lb 13.5 oz (53 kg)    Labs & Radiologic Studies    CBC Recent Labs    09/17/17 1526  09/18/17 0616 09/19/17 0251  WBC 7.9  --  7.5 7.5  NEUTROABS 4.3  --   --   --   HGB 13.9   < > 13.2 12.4*  HCT 41.8   < > 40.8 38.0*  MCV 103.5*  --  104.3* 103.0*  PLT 246  --  245 243   < > = values in this interval not displayed.   Basic Metabolic Panel Recent Labs    25/36/64 1526 09/17/17 1633 09/18/17 0616  NA 138 140 138  K 3.7 3.5 4.6  CL 103 103 106  CO2 24  --  25  GLUCOSE 89 107* 82  BUN 6 5* 7  CREATININE 1.05 0.80 1.08  CALCIUM 8.9  --  9.1   Liver Function Tests Recent Labs    09/17/17 1526  AST 15  ALT 11*  ALKPHOS 65  BILITOT 0.3  PROT 6.5  ALBUMIN 3.6   No results for input(s): LIPASE, AMYLASE in the last 72 hours. Cardiac Enzymes Recent Labs    09/17/17 1822 09/18/17 0007 09/18/17 0616  TROPONINI 3.52* 25.30* 15.04*   BNP Invalid input(s):  POCBNP D-Dimer No results for input(s): DDIMER in the last 72 hours. Hemoglobin A1C No results for input(s): HGBA1C in the last 72 hours. Fasting Lipid Panel Recent Labs    09/18/17 0616  CHOL 271*  HDL 35*  LDLCALC 197*  TRIG 194*  CHOLHDL 7.7   Thyroid Function Tests No results for input(s): TSH, T4TOTAL, T3FREE, THYROIDAB in the last 72 hours.  Invalid input(s): FREET3 _____________  Dg Chest Port 1 View  Result Date: 09/17/2017 CLINICAL DATA:  Chest pain. EXAM: PORTABLE CHEST 1 VIEW COMPARISON:  Radiographs 05/01/2014. FINDINGS: 1518 hours. The heart size and mediastinal contours stable. Coronary stents are again noted. The lungs appear mildly hyperinflated but clear. There is no pleural effusion or pneumothorax. The bones appear unremarkable. Telemetry  leads overlie the chest. IMPRESSION: No acute cardiopulmonary process.  Coronary artery stents. Electronically Signed   By: Carey Bullocks M.D.   On: 09/17/2017 16:03   Disposition   Pt is being discharged home today in good condition.  Follow-up Plans & Appointments    Follow-up Information    [facility] Follow up on 09/25/2017.   Why:  Please call in the am and ask for a hospital f/u appointment Contact information: [address] [place] [address] [phone]       Bradley Compton T, PA-C Follow up on 09/26/2017.   Specialties:  Cardiology, Physician Assistant Why:  at 11:15am for your follow up appt.  Contact information: [address] [place] [address] [phone]          Discharge Instructions    (HEART FAILURE PATIENTS) Call MD:  Anytime you have any of the following symptoms: 1) 3 pound weight gain in 24 hours or 5 pounds in 1 week 2) shortness of breath, with or without a dry hacking cough 3) swelling in the hands, feet or stomach 4) if you have to sleep on extra pillows at night in order to breathe.   Complete by:  As  directed    Amb Referral to Cardiac Rehabilitation   Complete by:  As directed    Diagnosis:   STEMI Coronary Stents PTCA     Call MD for:  redness, tenderness, or signs of infection (pain, swelling, redness, odor or green/yellow discharge around incision site)   Complete by:  As directed    Diet - low sodium heart healthy   Complete by:  As directed    Discharge instructions   Complete by:  As directed    Radial Site Care Refer to this sheet in the next few weeks. These instructions provide you with information on caring for yourself after your procedure. Your caregiver may also give you more specific instructions. Your treatment has been planned according to current medical practices, but problems sometimes occur. Call your caregiver if you have any problems or questions after your procedure. HOME CARE INSTRUCTIONS You may shower the day after the procedure.Remove the bandage (dressing) and gently wash the site with plain soap and water.Gently pat the site dry.  Do not apply powder or lotion to the site.  Do not submerge the affected site in water for 3 to 5 days.  Inspect the site at least twice daily.  Do not flex or bend the affected arm for 24 hours.  No lifting over 5 pounds (2.3 kg) for 5 days after your procedure.  Do not drive home if you are discharged the same day of the procedure. Have someone else drive you.  You may drive 24 hours after the procedure unless otherwise instructed by your caregiver.  What to expect: Any bruising will usually fade within 1 to 2 weeks.  Blood that collects in the tissue (hematoma) may be painful to the touch. It should usually decrease in size and tenderness within 1 to 2 weeks.  SEEK IMMEDIATE MEDICAL CARE IF: You have unusual pain at the radial site.  You have redness, warmth, swelling, or pain at the radial site.  You have drainage (other than a small amount of blood on the dressing).  You have chills.  You have a fever or persistent  symptoms for more than 72 hours.  You have a fever and your symptoms suddenly get worse.  Your arm  becomes pale, cool, tingly, or numb.  You have heavy bleeding from the site. Hold pressure on the site.   PLEASE DO NOT MISS ANY DOSES OF YOUR BRILINTA!!!!! Also keep a log of you blood pressures and bring back to your follow up appt. Please call the office with any questions.   Patients taking blood thinners should generally stay away from medicines like ibuprofen, Advil, Motrin, naproxen, and Aleve due to risk of stomach bleeding. You may take Tylenol as directed or talk to your primary doctor about alternatives.  Please keep your scheduled follow up appts!!   Increase activity slowly   Complete by:  As directed       Discharge Medications     Medication List    STOP taking these medications   escitalopram 10 MG tablet Commonly known as:  LEXAPRO     TAKE these medications   aspirin 81 MG EC tablet Take 1 tablet (81 mg total) by mouth daily.   atorvastatin 80 MG tablet Commonly known as:  LIPITOR Take 1 tablet (80 mg total) by mouth daily at 6 PM.   carvedilol 3.125 MG tablet Commonly known as:  COREG Take 1 tablet (3.125 mg total) by mouth 2 (two) times daily with a meal.   nitroGLYCERIN 0.4 MG SL tablet Commonly known as:  NITROSTAT Place 1 tablet (0.4 mg total) under the tongue every 5 (five) minutes x 3 doses as needed for chest pain.   sacubitril-valsartan 24-26 MG Commonly known as:  ENTRESTO Take 1 tablet by mouth 2 (two) times daily.   ticagrelor 90 MG Tabs tablet Commonly known as:  BRILINTA Take 1 tablet (90 mg total) by mouth 2 (two) times daily.        Aspirin prescribed at discharge?  Yes High Intensity Statin Prescribed? (Lipitor 40-80mg  or Crestor 20-40mg ): Yes Beta Blocker Prescribed? Yes For EF <40%, was ACEI/ARB Prescribed? Yes ADP Receptor Inhibitor Prescribed? (i.e. Plavix etc.-Includes Medically Managed Patients): Yes For EF <40%,  Aldosterone Inhibitor Prescribed? No: consider as outpatient. Was EF assessed during THIS hospitalization? Yes Was Cardiac Rehab II ordered? (Included Medically managed Patients): Yes   Outstanding Labs/Studies   BMET at follow up appt.   Duration of Discharge Encounter   Greater than 30 minutes including physician time.  Signed, Laverda Page NP-C 09/20/2017, 10:10 AM   I have examined the patient and reviewed assessment and plan and discussed with patient.  Agree with above as stated.  Please see my note from earlier today.  COntinue aggressive medical therapy for CAD and CHF.  Copay cards given to patient for Brilinta and Entresto.  Compliance will be paramount.  Lipid management will also be important.  COntinue high dose statin.  Smoking cessation as well will be helpful.   Lance Muss

## 2017-09-25 ENCOUNTER — Telehealth (HOSPITAL_COMMUNITY): Payer: Self-pay

## 2017-09-25 NOTE — Telephone Encounter (Signed)
Referral received. Attempted to call patient in regards to Cardiac Rehab and insurance - lm on vm

## 2017-09-26 ENCOUNTER — Ambulatory Visit (INDEPENDENT_AMBULATORY_CARE_PROVIDER_SITE_OTHER): Payer: Self-pay | Admitting: Physician Assistant

## 2017-09-26 ENCOUNTER — Telehealth: Payer: Self-pay

## 2017-09-26 ENCOUNTER — Encounter: Payer: Self-pay | Admitting: Physician Assistant

## 2017-09-26 VITALS — BP 118/64 | HR 80 | Ht 68.0 in | Wt 118.0 lb

## 2017-09-26 DIAGNOSIS — I5022 Chronic systolic (congestive) heart failure: Secondary | ICD-10-CM | POA: Insufficient documentation

## 2017-09-26 DIAGNOSIS — I255 Ischemic cardiomyopathy: Secondary | ICD-10-CM

## 2017-09-26 DIAGNOSIS — I10 Essential (primary) hypertension: Secondary | ICD-10-CM

## 2017-09-26 DIAGNOSIS — E782 Mixed hyperlipidemia: Secondary | ICD-10-CM

## 2017-09-26 DIAGNOSIS — F17200 Nicotine dependence, unspecified, uncomplicated: Secondary | ICD-10-CM

## 2017-09-26 DIAGNOSIS — I2111 ST elevation (STEMI) myocardial infarction involving right coronary artery: Secondary | ICD-10-CM

## 2017-09-26 DIAGNOSIS — Z9114 Patient's other noncompliance with medication regimen: Secondary | ICD-10-CM

## 2017-09-26 LAB — BASIC METABOLIC PANEL WITH GFR
BUN/Creatinine Ratio: 8 — ABNORMAL LOW (ref 9–20)
BUN: 9 mg/dL (ref 6–24)
CO2: 20 mmol/L (ref 20–29)
Calcium: 9.1 mg/dL (ref 8.7–10.2)
Chloride: 104 mmol/L (ref 96–106)
Creatinine, Ser: 1.14 mg/dL (ref 0.76–1.27)
GFR calc Af Amer: 88 mL/min/1.73 (ref >59)
GFR calc non Af Amer: 76 mL/min/1.73 (ref >59)
Glucose: 78 mg/dL (ref 65–99)
Potassium: 4.6 mmol/L (ref 3.5–5.2)
Sodium: 138 mmol/L (ref 134–144)

## 2017-09-26 MED ORDER — ASPIRIN EC 81 MG PO TBEC: 81.0000 mg | DELAYED_RELEASE_TABLET | Freq: Every day | ORAL | Status: AC

## 2017-09-26 NOTE — Progress Notes (Signed)
Cardiology Office Note:    Date:  09/26/2017   ID:  Bradley Compton, DOB [birth year], MRN [id number]  PCP:  Grayce Sessions, NP  Cardiologist:  Tonny Bollman, MD   Referring MD: Grayce Sessions, NP   Chief Complaint  Patient presents with  . Code STEMI    s/p DES to proximal RCA 09/2017    History of Present Illness:    Bradley Compton is a 47 y.o. male with a hx of DES to LAD, RCA, and Left Cx in 2012, inferior STEMI with DES to  09/17/17 with emergent PCI with DES to proximal RCA, resulting ischemic cardiomyopathy, HLD, and ongoing tobacco and alcohol use. He was dischargd on DAPT with ASA and brilitna for 12 months. Echo with reduced LVEF of 30-35%, ischemic cardiomyopathy and grade 1 DD. Troponin peaked at 25. He was discharged on high dose statin and entresto. It was noted that he was previously noncompliant with medications. CM was consulted and in discussion with the patient, determined he could afford his medications and was given copay cards.  He presents today for hospital follow up. Overall, he has been doing well. He denies chest pain, shortness of breath, orthopnea, and lower extremity swelling. No bleeding problems, but he has not been taking ASA. He hasn't been weighing daily or taking his pressure at home. He does avoid salt. He has not required nitro since discharge.     Past Medical History:  Diagnosis Date  . Alcoholism (HCC)   . Coronary artery disease    Xience V 2.5 x 15 mid LAD x 2, RCA Xience V 3.0 x 15 x 1, Xience V 3. x 23 mm x 1, 2012. [facility]  . GI bleed   . Hyperlipidemia   . Hypertension   . Myocardial infarction (HCC)    2012  . Nephrolithiasis     Past Surgical History:  Procedure Laterality Date  . Chest tube placement    . CORONARY/GRAFT ACUTE MI REVASCULARIZATION N/A 09/17/2017   Procedure: Coronary/Graft Acute MI Revascularization;  Surgeon: Tonny Bollman, MD;  Location: [facility] INVASIVE CV LAB;  Service: Cardiovascular;   Laterality: N/A;  . LEFT HEART CATH AND CORONARY ANGIOGRAPHY N/A 09/17/2017   Procedure: LEFT HEART CATH AND CORONARY ANGIOGRAPHY;  Surgeon: Tonny Bollman, MD;  Location: [facility] INVASIVE CV LAB;  Service: Cardiovascular;  Laterality: N/A;  . NEPHRECTOMY     2013    Current Medications: Current Meds  Medication Sig  . atorvastatin (LIPITOR) 80 MG tablet Take 1 tablet (80 mg total) by mouth daily at 6 PM.  . carvedilol (COREG) 3.125 MG tablet Take 1 tablet (3.125 mg total) by mouth 2 (two) times daily with a meal.  . nitroGLYCERIN (NITROSTAT) 0.4 MG SL tablet Place 1 tablet (0.4 mg total) under the tongue every 5 (five) minutes x 3 doses as needed for chest pain.  . sacubitril-valsartan (ENTRESTO) 24-26 MG Take 1 tablet by mouth 2 (two) times daily.  . ticagrelor (BRILINTA) 90 MG TABS tablet Take 1 tablet (90 mg total) by mouth 2 (two) times daily.  . [DISCONTINUED] aspirin 81 MG EC tablet Take 1 tablet (81 mg total) by mouth daily.     Allergies:   Other   Social History   Socioeconomic History  . Marital status: Unknown    Spouse name: Not on file  . Number of children: Not on file  . Years of education: Not on file  . Highest education level: Not on file  Occupational  History  . Not on file  Social Needs  . Financial resource strain: Not on file  . Food insecurity:    Worry: Not on file    Inability: Not on file  . Transportation needs:    Medical: Not on file    Non-medical: Not on file  Tobacco Use  . Smoking status: Current Every Day Smoker    Packs/day: 0.50    Years: 20.00    Pack years: 10.00    Types: Cigarettes  . Smokeless tobacco: Never Used  Substance and Sexual Activity  . Alcohol use: Yes    Alcohol/week: 1.8 - 2.4 oz    Types: 3 - 4 Cans of beer per week    Comment: Vodka everyday.  No longer drinks this  . Drug use: Never  . Sexual activity: Not on file  Lifestyle  . Physical activity:    Days per week: Not on file    Minutes per session: Not on file   . Stress: Not on file  Relationships  . Social connections:    Talks on phone: Not on file    Gets together: Not on file    Attends religious service: Not on file    Active member of club or organization: Not on file    Attends meetings of clubs or organizations: Not on file    Relationship status: Not on file  Other Topics Concern  . Not on file  Social History Narrative   Lives with girlfriend.       Family History: The patient's family history includes CAD in his maternal uncle; CAD (age of onset: 11) in his mother; Hypertension in his mother.  ROS:   Please see the history of present illness.    All other systems reviewed and are negative.  EKGs/Labs/Other Studies Reviewed:    The following studies were reviewed today:  Heart cath 09/17/17:  Mid RCA lesion is 20% stenosed.  Prox RCA to Mid RCA lesion is 50% stenosed.  Prox RCA lesion is 95% stenosed.  A drug-eluting stent was successfully placed using a STENT SIERRA 3.00 X 33 MM.  Post intervention, there is a 0% residual stenosis.  Post intervention, there is a 0% residual stenosis.  Post intervention, there is a 0% residual stenosis.  Mid LM to Dist LM lesion is 50% stenosed.  Prox LAD to Mid LAD lesion is 25% stenosed.  Prox LAD lesion is 70% stenosed.  Previously placed Prox Cx to Mid Cx stent (unknown type) is widely patent.  Ost Cx to Prox Cx lesion is 35% stenosed.  There is severe left ventricular systolic dysfunction.  LV end diastolic pressure is mildly elevated.  The left ventricular ejection fraction is 25-35% by visual estimate.   1. Acute inferior MI secondary to critical proximal RCA stenosis, treated successfully with PCI using a 3.0x33 mm Xience Sierra DES 2. Moderate residual CAD with 50% distal LM stenosis, 60-70% proximal LAD stenosis, and mild nonobstructive LCx stenosis 3. Continued patency of the stented segments in the LAD, RCA, and LCx 4. Severe segmental LV systolic  dysfunction, LVEF estimated at 30%  Recommend:  DAPT with ASA and ticagrelor minimum of 12 months (loaded with 180 mg ticagrelor in cath lab)  Tobacco/Etoh cessation  Secondary risk reduction measures/Post-MI medical therapy  Echo to reassess LVEF   Echo 09/18/17: Study Conclusions - Left ventricle: The cavity size was normal. Wall thickness was   normal. Systolic function was moderately to severely reduced. The   estimated  ejection fraction was in the range of 30% to 35%. There   is akinesis of the basalinferoseptal myocardium. There is   akinesis of the mid-apicalanteroseptal, anterior, anterolateral,   lateral, inferolateral, inferior, inferoseptal, and apical   myocardium. Doppler parameters are consistent with abnormal left   ventricular relaxation (grade 1 diastolic dysfunction). - Mitral valve: There was mild regurgitation.    EKG:  EKG is ordered today.  The ekg ordered today demonstrates ST elevation in V3, TWI in V4/5 - all unchanged from prior.  Recent Labs: 09/17/2017: ALT 11 09/18/2017: BUN 7; Creatinine, Ser 1.08; Potassium 4.6; Sodium 138 09/19/2017: Hemoglobin 12.4; Platelets 243  Recent Lipid Panel    Component Value Date/Time   CHOL 271 (H) 09/18/2017 0616   TRIG 194 (H) 09/18/2017 0616   HDL 35 (L) 09/18/2017 0616   CHOLHDL 7.7 09/18/2017 0616   VLDL 39 09/18/2017 0616   LDLCALC 197 (H) 09/18/2017 0616    Physical Exam:    VS:  BP 118/64   Pulse 80   Ht  (1.727 m)   Wt 118 lb (53.5 kg)   SpO2 98%   BMI 17.94 kg/m     Wt Readings from Last 3 Encounters:  09/26/17 118 lb (53.5 kg)  09/18/17 116 lb 13.5 oz (53 kg)  05/01/14 120 lb (54.4 kg)     GEN: Well nourished, well developed in no acute distress HEENT: Normal NECK: No JVD; No carotid bruits LYMPHATICS: No lymphadenopathy CARDIAC: RRR, no murmurs, rubs, gallops RESPIRATORY:  Clear to auscultation without rales, wheezing or rhonchi  ABDOMEN: Soft, non-tender,  non-distended MUSCULOSKELETAL:  No edema; No deformity  SKIN: Warm and dry, right radial site C/D/I NEUROLOGIC:  Alert and oriented x 3 PSYCHIATRIC:  Normal affect   ASSESSMENT:    1. STEMI involving right coronary artery (HCC)   2. Cardiomyopathy, ischemic   3. Chronic systolic heart failure (HCC)   4. Essential hypertension   5. Mixed hyperlipidemia   6. Tobacco use disorder   7. Noncompliance with medications    PLAN:    In order of problems listed above:  STEMI involving right coronary artery (HCC) CAD s/p inferior STEMI with DES to proximal RCA, patent stents in the LAD, RCA, and left Cx. He has not been taking ASA but has been compliant on brilinta. He will start taking an 81 mg ASA. No bleeding problems. No nitro since discharge. Radial site without infection. Instructed him to no longer use band-aids at the site.   Cardiomyopathy, ischemic Chronic systolic heart failure (HCC) I encouraged him to start weighing daily. He appears euvolemic on exam today. Will repeat an echo in 3 months to evaluate function. Continue coreg and entresto.  Will check a BMP today (single kidney).    Essential hypertension His pressure is 118/64 today. I do not have room to titrate any medications or add spironolactone. No medication changes. I encouraged him to start taking his pressure every day and keeping a log. He avoids salt.   Mixed hyperlipidemia 09/18/2017: Cholesterol 271; HDL 35; LDL Cholesterol 197; Triglycerides 194; VLDL 39 Pt was discharged on 80 mg lipitor. Will repeat lipids and LFTs in 6 weeks.    Tobacco use disorder He still smokes and is not going to quit.   Medication noncompliance He will start taking ASA. Paperwork given to Ms. Via for patient assistance for entresto and brilinta.   Medication Adjustments/Labs and Tests Ordered: Current medicines are reviewed at length with the patient today.  Concerns  regarding medicines are outlined above.  Orders Placed This  Encounter  Procedures  . Basic Metabolic Panel (BMET)  . Lipid Profile  . Hepatic function panel  . EKG 12-Lead  . ECHOCARDIOGRAM COMPLETE   Meds ordered this encounter  Medications  . aspirin EC 81 MG tablet    Sig: Take 1 tablet (81 mg total) by mouth daily.    Signed, Marcelino Duster, PA  09/26/2017 11:40 AM     [facility]

## 2017-09-26 NOTE — Telephone Encounter (Signed)
**Note De-Identified Kyriaki Moder Obfuscation** While at a f/u with Micah Flesher, PA-c today the pt turned in his [employer] pt assistance application for [employer]. I completed the provider part of the application, Micah Flesher, PA-c signed and I faxed it to [employer] pt asst program.

## 2017-09-26 NOTE — Patient Instructions (Signed)
Medication Instructions:  1. START ASPIRIN 81 MG DAILY  Labwork: 1. TODAY BMET  2. IN 6-8 WEEKS FASTING LIPID AND LIVER PANEL; NOTHING TO EAT AFTER MIDNIGHT THE NIGHT BEFORE LAB WORK. MORNING MEDICATIONS WITH WATER IS FINE    Testing/Procedures: Your physician has requested that you have an echocardiogram. Echocardiography is a painless test that uses sound waves to create images of your heart. It provides your doctor with information about the size and shape of your heart and how well your heart's chambers and valves are working. This procedure takes approximately one hour. There are no restrictions for this procedure.  THIS IS TO BE DONE 3 MONTHS    Follow-Up: 3 MONTH FOLLOW UP WITH SC[facility]ER, PAC SAME DAY DR. Excell Seltzer IS IN THE OFFICE   Any Other Special Instructions Will Be Listed Below (If Applicable).     If you need a refill on your cardiac medications before your next appointment, please call your pharmacy.

## 2017-09-26 NOTE — Telephone Encounter (Addendum)
While at a f/u with Micah Flesher, PA-c today the pt turned in his [employer] pt assistance for Brilinta. I completed the providers part, Micah Flesher, PA-c has signed it and I faxed it to [employer] pt asst program.

## 2017-09-27 ENCOUNTER — Telehealth: Payer: Self-pay | Admitting: *Deleted

## 2017-09-27 DIAGNOSIS — I5022 Chronic systolic (congestive) heart failure: Secondary | ICD-10-CM

## 2017-09-27 NOTE — Telephone Encounter (Signed)
Left message to go over lab results.  

## 2017-09-27 NOTE — Telephone Encounter (Signed)
Follow Up: ° ° ° °Returning your call, concerning pt's lab results. °

## 2017-09-27 NOTE — Telephone Encounter (Signed)
-----   Message from Marcelino Duster, [place] sent at 09/26/2017  4:59 PM EDT ----- Your kidney function is minimally reduced, but still in the normal range. I would like for you to hydrate with water, not sodas, and we will repeat the test in 2 weeks.

## 2017-09-27 NOTE — Telephone Encounter (Signed)
-----   Message from Angela Nicole Duke, PA sent at 09/26/2017  4:59 PM EDT ----- Your kidney function is minimally reduced, but still in the normal range. I would like for you to hydrate with water, not sodas, and we will repeat the test in 2 weeks. 

## 2017-09-27 NOTE — Telephone Encounter (Signed)
DPR ok to s/w pt's girlfriend Judeth Cornfield. Judeth Cornfield has been notified of lab results and recommendation for repeat bmet to be done in 2 weeks. Lab scheduled for 10/11/17. Pt's girlfriend thanked me for the call and is agreeable to plan of care for the pt.

## 2017-10-11 ENCOUNTER — Other Ambulatory Visit: Payer: Self-pay | Admitting: *Deleted

## 2017-10-11 DIAGNOSIS — I5022 Chronic systolic (congestive) heart failure: Secondary | ICD-10-CM

## 2017-10-11 LAB — BASIC METABOLIC PANEL
BUN/Creatinine Ratio: 9 (ref 9–20)
BUN: 9 mg/dL (ref 6–24)
CALCIUM: 9.4 mg/dL (ref 8.7–10.2)
CO2: 19 mmol/L — AB (ref 20–29)
CREATININE: 1.05 mg/dL (ref 0.76–1.27)
Chloride: 101 mmol/L (ref 96–106)
GFR, EST AFRICAN AMERICAN: 97 mL/min/{1.73_m2} (ref >59)
GFR, EST NON AFRICAN AMERICAN: 84 mL/min/{1.73_m2} (ref >59)
Glucose: 78 mg/dL (ref 65–99)
POTASSIUM: 4 mmol/L (ref 3.5–5.2)
Sodium: 138 mmol/L (ref 134–144)

## 2017-10-11 NOTE — Telephone Encounter (Signed)
**Note De-Identified Bradley Compton Obfuscation** When the pt came to the office this am for blood work he brought a hand written letter stating that he makes zero income and asked that I fax it to [employer] as they have advised him it was needed for his pt asst application. While here he requested and received 2 bottles of Brilinta samples.

## 2017-10-27 ENCOUNTER — Encounter (HOSPITAL_COMMUNITY): Payer: Self-pay

## 2017-10-27 ENCOUNTER — Telehealth (HOSPITAL_COMMUNITY): Payer: Self-pay

## 2017-10-27 NOTE — Telephone Encounter (Signed)
2nd attempt to call patient in regards to Cardiac Rehab and insurance - lm on vm. Sending letter. °

## 2017-11-07 ENCOUNTER — Other Ambulatory Visit: Payer: Self-pay

## 2017-11-07 DIAGNOSIS — E782 Mixed hyperlipidemia: Secondary | ICD-10-CM

## 2017-11-07 LAB — LIPID PANEL
Chol/HDL Ratio: 5.5 ratio — ABNORMAL HIGH (ref 0.0–5.0)
Cholesterol, Total: 205 mg/dL — ABNORMAL HIGH (ref 100–199)
HDL: 37 mg/dL — ABNORMAL LOW (ref >39)
LDL CALC: 134 mg/dL — AB (ref 0–99)
Triglycerides: 168 mg/dL — ABNORMAL HIGH (ref 0–149)
VLDL CHOLESTEROL CAL: 34 mg/dL (ref 5–40)

## 2017-11-07 LAB — HEPATIC FUNCTION PANEL
ALBUMIN: 4.8 g/dL (ref 3.5–5.5)
ALT: 15 IU/L (ref 0–44)
AST: 15 IU/L (ref 0–40)
Alkaline Phosphatase: 81 IU/L (ref 39–117)
Bilirubin Total: 0.7 mg/dL (ref 0.0–1.2)
Bilirubin, Direct: 0.15 mg/dL (ref 0.00–0.40)
Total Protein: 7.2 g/dL (ref 6.0–8.5)

## 2017-11-08 ENCOUNTER — Other Ambulatory Visit: Payer: Self-pay

## 2017-11-08 DIAGNOSIS — E785 Hyperlipidemia, unspecified: Secondary | ICD-10-CM

## 2017-11-14 ENCOUNTER — Encounter: Payer: Self-pay | Admitting: *Deleted

## 2017-11-15 ENCOUNTER — Other Ambulatory Visit: Payer: Self-pay

## 2017-11-15 DIAGNOSIS — E785 Hyperlipidemia, unspecified: Secondary | ICD-10-CM

## 2017-11-22 ENCOUNTER — Encounter: Payer: Self-pay | Admitting: Cardiovascular Disease

## 2018-01-02 ENCOUNTER — Encounter (INDEPENDENT_AMBULATORY_CARE_PROVIDER_SITE_OTHER): Payer: Self-pay

## 2018-01-02 ENCOUNTER — Ambulatory Visit (HOSPITAL_COMMUNITY): Payer: Self-pay | Attending: Cardiovascular Disease

## 2018-01-02 ENCOUNTER — Other Ambulatory Visit: Payer: Self-pay

## 2018-01-02 DIAGNOSIS — I255 Ischemic cardiomyopathy: Secondary | ICD-10-CM | POA: Insufficient documentation

## 2018-01-02 MED ORDER — PERFLUTREN LIPID MICROSPHERE
1.0000 mL | INTRAVENOUS | Status: AC | PRN
  Administered 2018-01-02: 1 mL via INTRAVENOUS

## 2018-01-09 ENCOUNTER — Encounter: Payer: Self-pay | Admitting: Physician Assistant

## 2018-01-09 DIAGNOSIS — I2510 Atherosclerotic heart disease of native coronary artery without angina pectoris: Secondary | ICD-10-CM | POA: Insufficient documentation

## 2018-01-09 HISTORY — DX: Atherosclerotic heart disease of native coronary artery without angina pectoris: I25.10

## 2018-01-09 NOTE — Progress Notes (Deleted)
Cardiology Office Note:    Date:  01/09/2018   ID:  Bradley Compton, DOB 1970/06/17, MRN [id number]  PCP:  Bradley Sessions, NP  Cardiologist:  Bradley Bollman, MD ***  Referring MD: Bradley Sessions, NP   No chief complaint on file. ***  History of Present Illness:    Bradley Compton is a 47 y.o. male with coronary artery disease s/p MI treated with multivessel stenting at [facility] in 2012 (DES to LAD, DES to RCA and DES to LCx), ischemic cardiomyopathy (EF 45-50 by Echo in 2015).  He was admitted in 09/2017 with an inferior STEMI.  Cardiac Catheterization demonstrated critical proximal RCA stenosis that was treated with a DES.  He had residual moderate dist LM stenosis, proximal LAD stenosis and mild disease in the LCx.  Previous stents were patent.  His EF was 30-35 by Echo with multiple wall motion abnormalities.  He was last seen in clinic in 09/2017.  A follow up echocardiogram in 12/2017 demonstrated minimally improved LVF with EF 35-40 and apical scarring and dyskinesis (borderline aneurysmal) c/w with LAD infarct, mod diastolic dysfunction and no LV thrombus.    [name] ***  Prior CV studies:   The following studies were reviewed today:  Echo 01/02/18 EF 35-40, apical scarring and DK (borderline aneurysmal), Gr 2 DD, no LV thrombus  Echo 09/18/17 EF 30-35, inf-sept, ant-sept, ant, ant-lat, lat, inf-lat, inf, inf-sept and apical AK, Gr 1 DD, mild MR  Cardiac Catheterization 09/17/17 LM 50 LAD prox 70, mid stent patent with 25 ISR LCx ost 35, mid stent patent RCA prox 95, mid 50, mid stent patent with 20 ISR EFj 25-35 PCI:  3 x 33 mm Sierra DES to prox-mid RCA  1. Acute inferior MI secondary to critical proximal RCA stenosis, treated successfully with PCI using a 3.0x33 mm Xience Sierra DES 2. Moderate residual CAD with 50% distal LM stenosis, 60-70% proximal LAD stenosis, and mild nonobstructive LCx stenosis 3. Continued patency of the stented segments in the LAD, RCA,  and LCx 4. Severe segmental LV systolic dysfunction, LVEF estimated at 30%           Past Medical History:  Diagnosis Date  . Alcoholism (HCC)   . CAD (coronary artery disease) 01/09/2018   Hx of MI in 2012 tx with DES to LAD, DES to RCA and DES to LCx at [facility] // Inf STEMI 5/19 >> LHC - dLM 50, pLAD 70, mLAD stent ok w/ 25 ISR, oLCx 35, mLCx stent ok, pRCA 95, mRCA 50, mRCA stent ok with 20 ISR, EF 25-35 >> PCI:  DES to prox RCA  . Coronary artery disease    Xience V 2.5 x 15 mid LAD x 2, RCA Xience V 3.0 x 15 x 1, Xience V 3. x 23 mm x 1, 2012. [facility]  . GI bleed   . Hyperlipidemia   . Hypertension   . Myocardial infarction (HCC)    2012  . Nephrolithiasis    Surgical Hx: The patient  has a past surgical history that includes Chest tube placement; Nephrectomy; Coronary/Graft Acute MI Revascularization (N/A, 09/17/2017); and LEFT HEART CATH AND CORONARY ANGIOGRAPHY (N/A, 09/17/2017).   Current Medications: No outpatient medications have been marked as taking for the 01/10/18 encounter (Appointment) with Bradley Newcomer T, PA-C.     Allergies:   Other   Social History   Tobacco Use  . Smoking status: Current Every Day Smoker    Packs/day: 0.50    Years:  20.00    Pack years: 10.00    Types: Cigarettes  . Smokeless tobacco: Never Used  Substance Use Topics  . Alcohol use: Yes    Alcohol/week: 3.0 - 4.0 standard drinks    Types: 3 - 4 Cans of beer per week    Comment: Vodka everyday.  No longer drinks this  . Drug use: Never     Family Hx: The patient's family history includes CAD in his maternal uncle; CAD (age of onset: 7355) in his mother; Hypertension in his mother.  ROS:   Please see the history of present illness.    ROS All other systems reviewed and are negative.   EKGs/Labs/Other Test Reviewed:    EKG:  EKG is *** ordered today.  The ekg ordered today demonstrates ***  Recent Labs: 09/19/2017: Hemoglobin 12.4; Platelets 243 10/11/2017: BUN 9;  Creatinine, Ser 1.05; Potassium 4.0; Sodium 138 11/07/2017: ALT 15   Recent Lipid Panel Lab Results  Component Value Date/Time   CHOL 205 (H) 11/07/2017 08:42 AM   TRIG 168 (H) 11/07/2017 08:42 AM   HDL 37 (L) 11/07/2017 08:42 AM   CHOLHDL 5.5 (H) 11/07/2017 08:42 AM   CHOLHDL 7.7 09/18/2017 06:16 AM   LDLCALC 134 (H) 11/07/2017 08:42 AM    Physical Exam:    VS:  There were no vitals taken for this visit.    Wt Readings from Last 3 Encounters:  09/26/17 118 lb (53.5 kg)  09/18/17 116 lb 13.5 oz (53 kg)  05/01/14 120 lb (54.4 kg)     ***Physical Exam  ASSESSMENT & PLAN:    Chronic systolic heart failure (HCC)  Coronary artery disease involving native coronary artery of native heart without angina pectoris  Essential hypertension  Mixed hyperlipidemia  Tobacco use disorder***  Dispo:  No follow-ups on file.   Medication Adjustments/Labs and Tests Ordered: Current medicines are reviewed at length with the patient today.  Concerns regarding medicines are outlined above.  Tests Ordered: No orders of the defined types were placed in this encounter.  Medication Changes: No orders of the defined types were placed in this encounter.   Signed, Bradley NewcomerScott Nataliah Hatlestad, PA-C  01/09/2018 10:41 PM    Bradley Compton [address], [place], [place]  [id number] Phone: [phone]; Fax: [phone]

## 2018-01-10 ENCOUNTER — Ambulatory Visit: Payer: Self-pay | Admitting: Physician Assistant

## 2018-01-10 DIAGNOSIS — R0989 Other specified symptoms and signs involving the circulatory and respiratory systems: Secondary | ICD-10-CM

## 2018-02-09 ENCOUNTER — Telehealth: Payer: Self-pay

## 2018-02-09 NOTE — Telephone Encounter (Signed)
The pt brought in an [place] [employer] [place] Pt Asst application for Brilinta. I have completed the provider part and placed it in Dr Randolm Idol mail bin awaiting his signature.

## 2018-02-21 NOTE — Telephone Encounter (Signed)
Dr Excell Seltzer has signed the pts [place] [employer] [place] pt asst application and I have faxed it to [employer] [place].

## 2018-06-19 NOTE — Telephone Encounter (Signed)
Received a phone call from a rep with [employer] who advised me that the pt will need to: 1. Provide a signed letter stating that he earns no money (as he indicated on his application) 2. Apply for Medicaid  3. If he is denied Medicaid he will need to provide the denial letter to [facility] [employer]  Per the [employer] rep they are sending the pt a one time only 30 day supply of Brilinta to last until this is resolved.

## 2018-06-20 NOTE — Telephone Encounter (Signed)
I have left a message on the pts VM asking him to call us back.

## 2018-06-26 NOTE — Telephone Encounter (Signed)
**Note De-identified Bradley Compton Obfuscation** LMTCB

## 2020-01-03 ENCOUNTER — Other Ambulatory Visit: Payer: Self-pay

## 2020-02-26 IMAGING — DX DG CHEST 1V PORT
1 series · 1 of 1 positions shown · non-contrast
Comparison: Radiographs 05/01/2014.

CLINICAL DATA: Chest pain.

EXAM:
PORTABLE CHEST 1 VIEW

[chest]
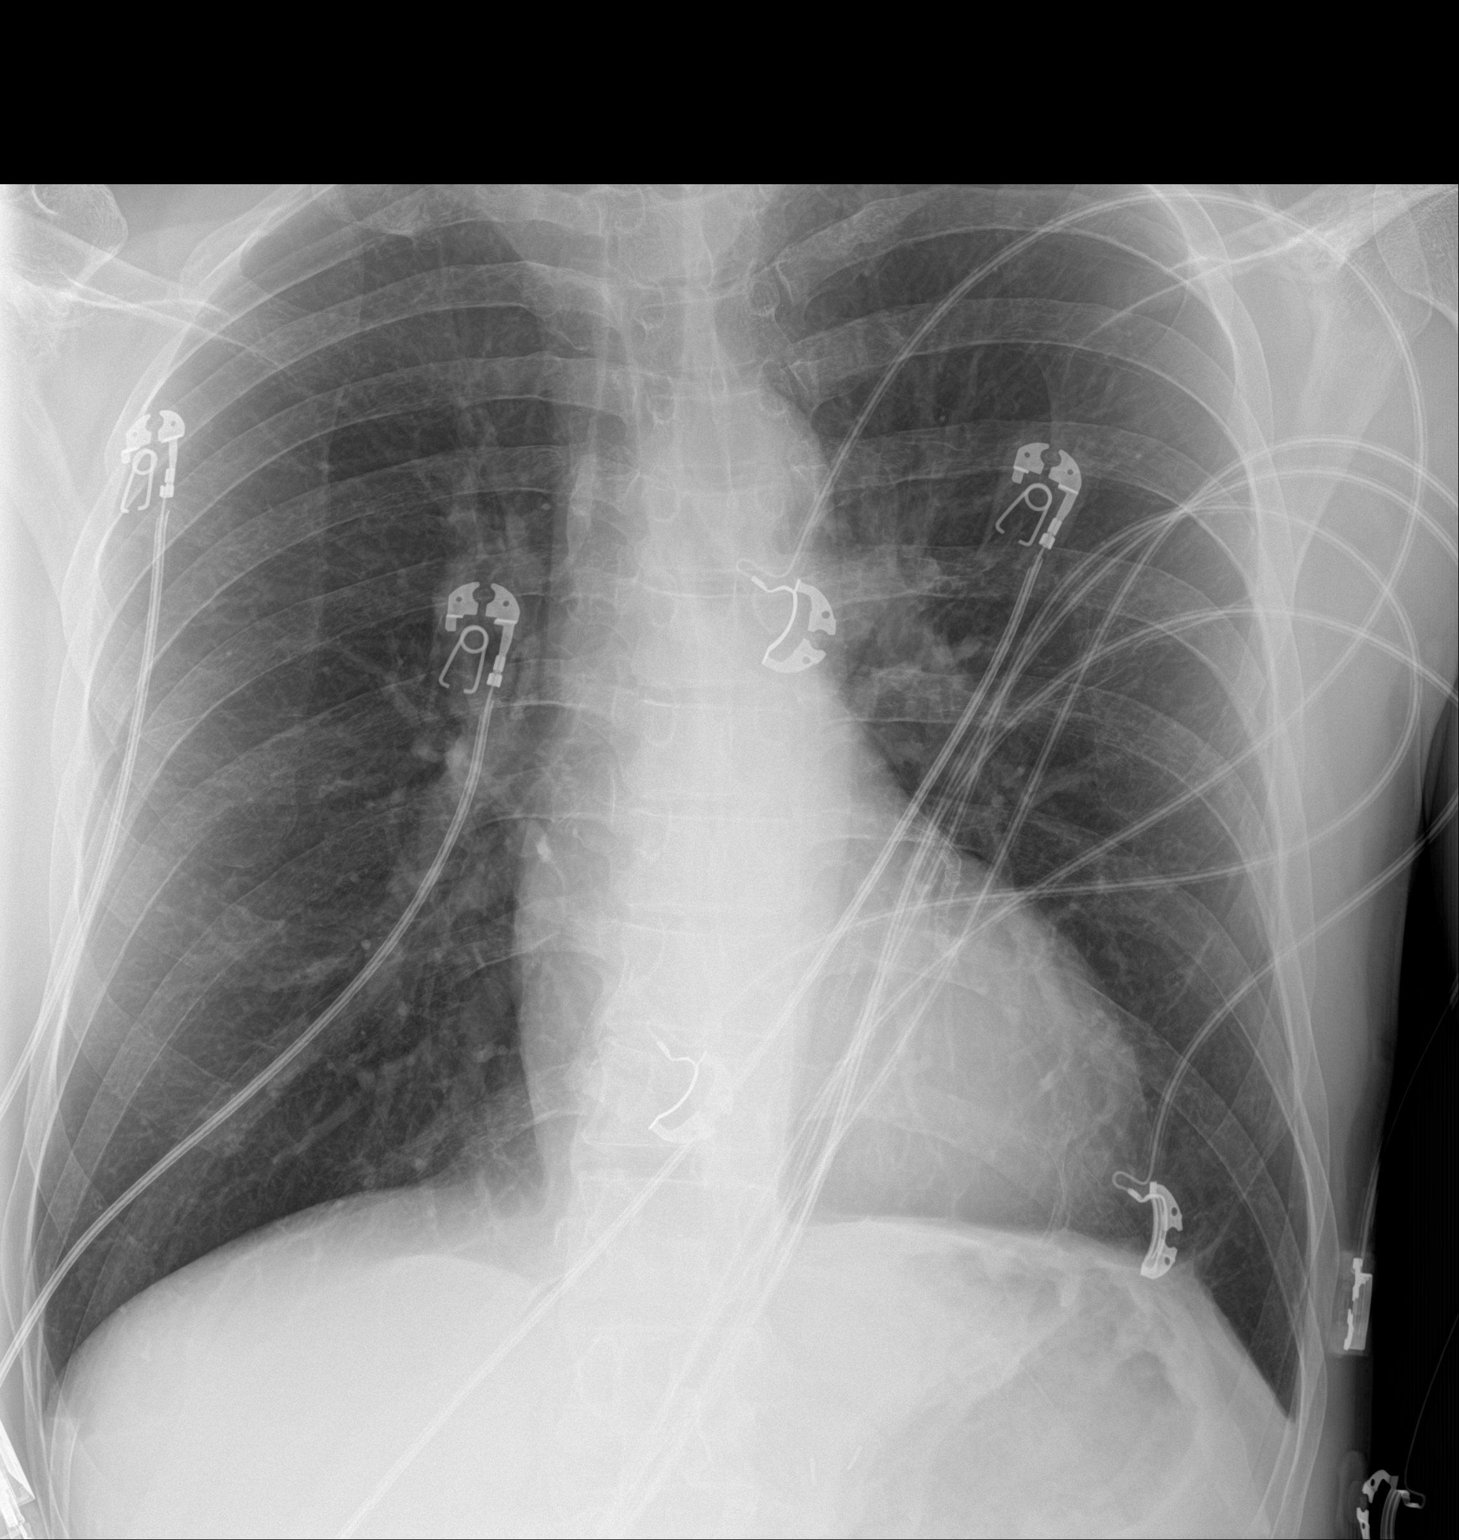

[1 of 1 positions shown; findings below may reference images not displayed]

FINDINGS: 8984 hours. The heart size and mediastinal contours stable. Coronary
stents are again noted. The lungs appear mildly hyperinflated but
clear. There is no pleural effusion or pneumothorax. The bones
appear unremarkable. Telemetry leads overlie the chest.
IMPRESSION: No acute cardiopulmonary process.  Coronary artery stents.

## 2023-11-08 ENCOUNTER — Encounter (HOSPITAL_COMMUNITY)
Admission: RE | Admit: 2023-11-08 | Discharge: 2023-11-08 | Disposition: A | Discharge status: Home / Self Care | Payer: MEDICAID | Source: Ambulatory Visit | Attending: Thoracic Surgery (Cardiothoracic Vascular Surgery) | Admitting: Thoracic Surgery (Cardiothoracic Vascular Surgery)

## 2023-11-08 VITALS — Ht 68.0 in | Wt 132.7 lb

## 2023-11-08 DIAGNOSIS — Z951 Presence of aortocoronary bypass graft: Secondary | ICD-10-CM | POA: Insufficient documentation

## 2023-11-08 DIAGNOSIS — I214 Non-ST elevation (NSTEMI) myocardial infarction: Secondary | ICD-10-CM | POA: Insufficient documentation

## 2023-11-08 NOTE — Patient Instructions (Signed)
 Patient Instructions  Patient Details  Name: Bradley Compton MRN: [id number] Date of Birth: April 14, 1971 Referring Provider:  Burnie Dallas Coad, MD  Below are your personal goals for exercise, nutrition, and risk factors. Our goal is to help you stay on track towards obtaining and maintaining these goals. We will be discussing your progress on these goals with you throughout the program.  Initial Exercise Prescription:  Initial Exercise Prescription - 11/08/23 1500       Date of Initial Exercise RX and Referring Provider   Date 11/08/23    Referring Provider Burnie Dallas MD      Treadmill   MPH 2    Grade 0.5    Minutes 15    METs 2.67      REL-XR   Level 2    Speed 50    Minutes 15    METs 1.9      Prescription Details   Frequency (times per week) 2    Duration Progress to 30 minutes of continuous aerobic without signs/symptoms of physical distress      Intensity   THRR 40-80% of Max Heartrate 112-148    Ratings of Perceived Exertion 11-13    Perceived Dyspnea 0-4      Resistance Training   Training Prescription Yes    Weight 4    Reps 10-15          Exercise Goals: Frequency: Be able to perform aerobic exercise two to three times per week in program working toward 2-5 days per week of home exercise.  Intensity: Work with a perceived exertion of 11 (fairly light) - 15 (hard) while following your exercise prescription.  We will make changes to your prescription with you as you progress through the program.   Duration: Be able to do 30 to 45 minutes of continuous aerobic exercise in addition to a 5 minute warm-up and a 5 minute cool-down routine.   Nutrition Goals: Your personal nutrition goals will be established when you do your nutrition analysis with the dietician.  The following are general nutrition guidelines to follow: Cholesterol < 200mg /day Sodium < 1500mg /day Fiber: Men over 50 yrs - 30 grams per day  Personal Goals:  Personal Goals and  Risk Factors at Admission - 11/08/23 1515       Core Components/Risk Factors/Patient Goals on Admission    Weight Management Weight Maintenance    Heart Failure Yes    Intervention Provide a combined exercise and nutrition program that is supplemented with education, support and counseling about heart failure. Directed toward relieving symptoms such as shortness of breath, decreased exercise tolerance, and extremity edema.    Expected Outcomes Improve functional capacity of life;Short term: Attendance in program 2-3 days a week with increased exercise capacity. Reported lower sodium intake. Reported increased fruit and vegetable intake. Reports medication compliance.;Short term: Daily weights obtained and reported for increase. Utilizing diuretic protocols set by physician.;Long term: Adoption of self-care skills and reduction of barriers for early signs and symptoms recognition and intervention leading to self-care maintenance.    Hypertension Yes    Intervention Monitor prescription use compliance.;Provide education on lifestyle modifcations including regular physical activity/exercise, weight management, moderate sodium restriction and increased consumption of fresh fruit, vegetables, and low fat dairy, alcohol moderation, and smoking cessation.    Expected Outcomes Short Term: Continued assessment and intervention until BP is < 140/32mm HG in hypertensive participants. < 130/90mm HG in hypertensive participants with diabetes, heart failure or chronic kidney disease.;Long Term: Maintenance of  blood pressure at goal levels.    Lipids Yes    Intervention Provide education and support for participant on nutrition & aerobic/resistive exercise along with prescribed medications to achieve LDL 70mg , HDL >40mg .    Expected Outcomes Long Term: Cholesterol controlled with medications as prescribed, with individualized exercise RX and with personalized nutrition plan. Value goals: LDL < 70mg , HDL > 40  mg.;Short Term: Participant states understanding of desired cholesterol values and is compliant with medications prescribed. Participant is following exercise prescription and nutrition guidelines.          Tobacco Use Initial Evaluation: Social History   Tobacco Use  Smoking Status Every Day   Current packs/day: 0.50   Average packs/day: 0.5 packs/day for 20.0 years (10.0 ttl pk-yrs)   Types: Cigarettes  Smokeless Tobacco Never    Exercise Goals and Review:  Exercise Goals     Row Name 11/08/23 1540             Exercise Goals   Increase Physical Activity Yes       Intervention Provide advice, education, support and counseling about physical activity/exercise needs.;Develop an individualized exercise prescription for aerobic and resistive training based on initial evaluation findings, risk stratification, comorbidities and participant's personal goals.       Expected Outcomes Short Term: Attend rehab on a regular basis to increase amount of physical activity.;Long Term: Add in home exercise to make exercise part of routine and to increase amount of physical activity.;Long Term: Exercising regularly at least 3-5 days a week.       Increase Strength and Stamina Yes       Intervention Provide advice, education, support and counseling about physical activity/exercise needs.;Develop an individualized exercise prescription for aerobic and resistive training based on initial evaluation findings, risk stratification, comorbidities and participant's personal goals.       Expected Outcomes Short Term: Increase workloads from initial exercise prescription for resistance, speed, and METs.;Short Term: Perform resistance training exercises routinely during rehab and add in resistance training at home;Long Term: Improve cardiorespiratory fitness, muscular endurance and strength as measured by increased METs and functional capacity ( )       Able to understand and use rate of perceived exertion  (RPE) scale Yes       Intervention Provide education and explanation on how to use RPE scale       Expected Outcomes Short Term: Able to use RPE daily in rehab to express subjective intensity level;Long Term:  Able to use RPE to guide intensity level when exercising independently       Able to understand and use Dyspnea scale Yes       Intervention Provide education and explanation on how to use Dyspnea scale       Expected Outcomes Short Term: Able to use Dyspnea scale daily in rehab to express subjective sense of shortness of breath during exertion;Long Term: Able to use Dyspnea scale to guide intensity level when exercising independently       Knowledge and understanding of Target Heart Rate Range (THRR) Yes       Intervention Provide education and explanation of THRR including how the numbers were predicted and where they are located for reference       Expected Outcomes Short Term: Able to state/look up THRR;Long Term: Able to use THRR to govern intensity when exercising independently;Short Term: Able to use daily as guideline for intensity in rehab       Able to check pulse independently Yes  Intervention Provide education and demonstration on how to check pulse in carotid and radial arteries.;Review the importance of being able to check your own pulse for safety during independent exercise       Expected Outcomes Long Term: Able to check pulse independently and accurately;Short Term: Able to explain why pulse checking is important during independent exercise       Understanding of Exercise Prescription Yes       Intervention Provide education, explanation, and written materials on patient's individual exercise prescription       Expected Outcomes Short Term: Able to explain program exercise prescription;Long Term: Able to explain home exercise prescription to exercise independently          Copy of goals given to participant.

## 2023-11-08 NOTE — Progress Notes (Signed)
 Cardiac Individual Treatment Plan  Patient Details  Name: Bradley Compton MRN: [id number] Date of Birth: Feb 08, 1971 Referring Provider:   Flowsheet Row CARDIAC REHAB PHASE II ORIENTATION from 11/08/2023 in [facility] CARDIAC REHABILITATION  Referring Provider Burnie Loving MD    Initial Encounter Date:  Flowsheet Row CARDIAC REHAB PHASE II ORIENTATION from 11/08/2023 in [facility] CARDIAC REHABILITATION  Date 11/08/23    Visit Diagnosis: S/P CABG x 3  NSTEMI (non-ST elevated myocardial infarction) (HCC)  Patient's Home Medications on Admission:  Current Outpatient Medications:    apixaban (ELIQUIS) 5 MG TABS tablet, Take 5 mg by mouth 2 (two) times daily., Disp: , Rfl:    aspirin  EC 81 MG tablet, Take 1 tablet (81 mg total) by mouth daily., Disp: , Rfl:    atorvastatin  (LIPITOR ) 80 MG tablet, Take 1 tablet (80 mg total) by mouth daily at 6 PM., Disp: 30 tablet, Rfl: 2   losartan (COZAAR) 25 MG tablet, Take 12.5 mg by mouth daily., Disp: , Rfl:    metoprolol tartrate (LOPRESSOR) 25 MG tablet, Take 25 mg by mouth 2 (two) times daily., Disp: , Rfl:    Multiple Vitamin (MULTIVITAMIN ADULT PO), Take 1 tablet by mouth daily., Disp: , Rfl:    nitroGLYCERIN  (NITROSTAT ) 0.4 MG SL tablet, Place 1 tablet (0.4 mg total) under the tongue every 5 (five) minutes x 3 doses as needed for chest pain., Disp: 25 tablet, Rfl: 1   pantoprazole  (PROTONIX ) 40 MG tablet, Take 40 mg by mouth daily., Disp: , Rfl:    sacubitril -valsartan  (ENTRESTO ) 24-26 MG, Take 1 tablet by mouth 2 (two) times daily., Disp: 60 tablet, Rfl: 1   spironolactone (ALDACTONE) 25 MG tablet, Take 12.5 mg by mouth daily., Disp: , Rfl:    carvedilol  (COREG ) 3.125 MG tablet, Take 1 tablet (3.125 mg total) by mouth 2 (two) times daily with a meal. (Patient not taking: Reported on 11/08/2023), Disp: 60 tablet, Rfl: 2   ticagrelor  (BRILINTA ) 90 MG TABS tablet, Take 1 tablet (90 mg total) by mouth 2 (two) times daily. (Patient not taking:  Reported on 11/08/2023), Disp: 60 tablet, Rfl: 2  Past Medical History: Past Medical History:  Diagnosis Date   Alcoholism (HCC)    CAD (coronary artery disease) 01/09/2018   Hx of MI in 2012 tx with DES to LAD, DES to RCA and DES to LCx at [facility] // Inf STEMI 5/19 >> LHC - dLM 50, pLAD 70, mLAD stent ok w/ 25 ISR, oLCx 35, mLCx stent ok, pRCA 95, mRCA 50, mRCA stent ok with 20 ISR, EF 25-35 >> PCI:  DES to prox RCA   Coronary artery disease    Xience V 2.5 x 15 mid LAD x 2, RCA Xience V 3.0 x 15 x 1, Xience V 3. x 23 mm x 1, 2012. High [facility]   GI bleed    Hyperlipidemia    Hypertension    Myocardial infarction [facility])    2012   Nephrolithiasis     Tobacco Use: Social History   Tobacco Use  Smoking Status Every Day   Current packs/day: 0.50   Average packs/day: 0.5 packs/day for 20.0 years (10.0 ttl pk-yrs)   Types: Cigarettes  Smokeless Tobacco Never    Labs: Review Flowsheet       Latest Ref Rng & Units 09/17/2017 09/18/2017 11/07/2017  Labs for ITP Cardiac and Pulmonary Rehab  Cholestrol 100 - 199 mg/dL 716  728  794   LDL (calc) 0 - 99  mg/dL 802  802  865   HDL-C >60 mg/dL 38  35  37   Trlycerides 0 - 149 mg/dL 760  805  831   TCO2 22 - 32 mmol/L 23  - -    Capillary Blood Glucose: Lab Results  Component Value Date   GLUCAP 103 (H) 05/03/2014   GLUCAP 103 (H) 05/02/2014   GLUCAP 76 05/02/2014   GLUCAP 119 (H) 05/01/2014   GLUCAP 74 05/01/2014     Exercise Target Goals: Exercise Program Goal: Individual exercise prescription set using results from initial 6 min walk test and THRR while considering  patient's activity barriers and safety.   Exercise Prescription Goal: Starting with aerobic activity 30 plus minutes a day, 3 days per week for initial exercise prescription. Provide home exercise prescription and guidelines that participant acknowledges understanding prior to discharge.  Activity Barriers & Risk Stratification:  Activity Barriers & Cardiac  Risk Stratification - 11/08/23 1429       Activity Barriers & Cardiac Risk Stratification   Activity Barriers None    Cardiac Risk Stratification High          6 Minute Walk:  6 Minute Walk     Row Name 11/08/23 1538         6 Minute Walk   Phase Initial     Distance 1330 feet     Walk Time 6 minutes     # of Rest Breaks 0     MPH 2.52     METS 4.14     RPE 12     VO2 Peak 14.5     Symptoms No     Resting HR 75 bpm     Resting BP 118/68     Resting Oxygen Saturation  96 %     Exercise Oxygen Saturation  during 6 min walk 96 %     Max Ex. HR 81 bpm     Max Ex. BP 130/70     2 Minute Post BP 120/68        Oxygen Initial Assessment:   Oxygen Re-Evaluation:   Oxygen Discharge (Final Oxygen Re-Evaluation):   Initial Exercise Prescription:  Initial Exercise Prescription - 11/08/23 1500       Date of Initial Exercise RX and Referring Provider   Date 11/08/23    Referring Provider Burnie Loving MD      Treadmill   MPH 2    Grade 0.5    Minutes 15    METs 2.67      REL-XR   Level 2    Speed 50    Minutes 15    METs 1.9      Prescription Details   Frequency (times per week) 2    Duration Progress to 30 minutes of continuous aerobic without signs/symptoms of physical distress      Intensity   THRR 40-80% of Max Heartrate 112-148    Ratings of Perceived Exertion 11-13    Perceived Dyspnea 0-4      Resistance Training   Training Prescription Yes    Weight 4    Reps 10-15          Perform Capillary Blood Glucose checks as needed.  Exercise Prescription Changes:   Exercise Comments:   Exercise Goals and Review:   Exercise Goals     Row Name 11/08/23 1540             Exercise Goals   Increase Physical Activity Yes  Intervention Provide advice, education, support and counseling about physical activity/exercise needs.;Develop an individualized exercise prescription for aerobic and resistive training based on initial  evaluation findings, risk stratification, comorbidities and participant's personal goals.       Expected Outcomes Short Term: Attend rehab on a regular basis to increase amount of physical activity.;Long Term: Add in home exercise to make exercise part of routine and to increase amount of physical activity.;Long Term: Exercising regularly at least 3-5 days a week.       Increase Strength and Stamina Yes       Intervention Provide advice, education, support and counseling about physical activity/exercise needs.;Develop an individualized exercise prescription for aerobic and resistive training based on initial evaluation findings, risk stratification, comorbidities and participant's personal goals.       Expected Outcomes Short Term: Increase workloads from initial exercise prescription for resistance, speed, and METs.;Short Term: Perform resistance training exercises routinely during rehab and add in resistance training at home;Long Term: Improve cardiorespiratory fitness, muscular endurance and strength as measured by increased METs and functional capacity ( )       Able to understand and use rate of perceived exertion (RPE) scale Yes       Intervention Provide education and explanation on how to use RPE scale       Expected Outcomes Short Term: Able to use RPE daily in rehab to express subjective intensity level;Long Term:  Able to use RPE to guide intensity level when exercising independently       Able to understand and use Dyspnea scale Yes       Intervention Provide education and explanation on how to use Dyspnea scale       Expected Outcomes Short Term: Able to use Dyspnea scale daily in rehab to express subjective sense of shortness of breath during exertion;Long Term: Able to use Dyspnea scale to guide intensity level when exercising independently       Knowledge and understanding of Target Heart Rate Range (THRR) Yes       Intervention Provide education and explanation of THRR including how  the numbers were predicted and where they are located for reference       Expected Outcomes Short Term: Able to state/look up THRR;Long Term: Able to use THRR to govern intensity when exercising independently;Short Term: Able to use daily as guideline for intensity in rehab       Able to check pulse independently Yes       Intervention Provide education and demonstration on how to check pulse in carotid and radial arteries.;Review the importance of being able to check your own pulse for safety during independent exercise       Expected Outcomes Long Term: Able to check pulse independently and accurately;Short Term: Able to explain why pulse checking is important during independent exercise       Understanding of Exercise Prescription Yes       Intervention Provide education, explanation, and written materials on patient's individual exercise prescription       Expected Outcomes Short Term: Able to explain program exercise prescription;Long Term: Able to explain home exercise prescription to exercise independently          Exercise Goals Re-Evaluation :    Discharge Exercise Prescription (Final Exercise Prescription Changes):   Nutrition:  Target Goals: Understanding of nutrition guidelines, daily intake of sodium 1500mg , cholesterol 200mg , calories 30% from fat and 7% or less from saturated fats, daily to have 5 or more servings of fruits and vegetables.  Biometrics:  Pre Biometrics - 11/08/23 1541       Pre Biometrics   Height 5' 8 (1.727 m)    Weight 60.2 kg    Waist Circumference 32 inches    Hip Circumference 35 inches    Waist to Hip Ratio 0.91 %    BMI (Calculated) 20.18    Grip Strength 40.8 kg           Nutrition Therapy Plan and Nutrition Goals:   Nutrition Assessments:  MEDIFICTS Score Key: >=70 Need to make dietary changes  40-70 Heart Healthy Diet <= 40 Therapeutic Level Cholesterol Diet  Flowsheet Row CARDIAC REHAB PHASE II ORIENTATION from 11/08/2023  in [facility] CARDIAC REHABILITATION  Picture Your Plate Total Score on Admission 58   Picture Your Plate Scores: <59 Unhealthy dietary pattern with much room for improvement. 41-50 Dietary pattern unlikely to meet recommendations for good health and room for improvement. 51-60 More healthful dietary pattern, with some room for improvement.  >60 Healthy dietary pattern, although there may be some specific behaviors that could be improved.    Nutrition Goals Re-Evaluation:   Nutrition Goals Discharge (Final Nutrition Goals Re-Evaluation):   Psychosocial: Target Goals: Acknowledge presence or absence of significant depression and/or stress, maximize coping skills, provide positive support system. Participant is able to verbalize types and ability to use techniques and skills needed for reducing stress and depression.  Initial Review & Psychosocial Screening:  Initial Psych Review & Screening - 11/08/23 1515       Initial Review   Current issues with Current Anxiety/Panic;Current Sleep Concerns      Family Dynamics   Good Support System? Yes      Barriers   Psychosocial barriers to participate in program The patient should benefit from training in stress management and relaxation.      Screening Interventions   Interventions Encouraged to exercise;Provide feedback about the scores to participant    Expected Outcomes Short Term goal: Utilizing psychosocial counselor, staff and physician to assist with identification of specific Stressors or current issues interfering with healing process. Setting desired goal for each stressor or current issue identified.;Long Term Goal: Stressors or current issues are controlled or eliminated.;Short Term goal: Identification and review with participant of any Quality of Life or Depression concerns found by scoring the questionnaire.;Long Term goal: The participant improves quality of Life and PHQ9 Scores as seen by post scores and/or verbalization of  changes          Quality of Life Scores:  Quality of Life - 11/08/23 1541       Quality of Life   Select Quality of Life      Quality of Life Scores   Health/Function Pre 16.23 %    Socioeconomic Pre 13.64 %    Psych/Spiritual Pre 17.07 %    Family Pre 12.5 %    GLOBAL Pre 15.5 %         Scores of 19 and below usually indicate a poorer quality of life in these areas.  A difference of  2-3 points is a clinically meaningful difference.  A difference of 2-3 points in the total score of the Quality of Life Index has been associated with significant improvement in overall quality of life, self-image, physical symptoms, and general health in studies assessing change in quality of life.  PHQ-9: Review Flowsheet       11/08/2023  Depression screen PHQ 2/9  Decreased Interest 2  Down, Depressed, Hopeless 1  PHQ -  2 Score 3  Altered sleeping 1  Tired, decreased energy 1  Change in appetite 1  Feeling bad or failure about yourself  1  Moving slowly or fidgety/restless 2  Suicidal thoughts 0  PHQ-9 Score 9  Difficult doing work/chores Somewhat difficult   Interpretation of Total Score  Total Score Depression Severity:  1-4 = Minimal depression, 5-9 = Mild depression, 10-14 = Moderate depression, 15-19 = Moderately severe depression, 20-27 = Severe depression   Psychosocial Evaluation and Intervention:  Psychosocial Evaluation - 11/08/23 1515       Psychosocial Evaluation & Interventions   Interventions Encouraged to exercise with the program and follow exercise prescription;Relaxation education;Stress management education    Comments Patient was referred to CR with CABGx3 and NSTEMI from [facility]. He says he has had a history of multiple MI's with stents. His initial PHQ-9 score was 9. He says he has a history of anxiety which has been treated in the past with therapy and antidepressants. He stopped the medication due to decreased libido. He denies depression but says his  girlfriend says he is depressed. He has not worked for several years due to burn out. He moved in with his girlfriend after his surgery. She is his only support person. He says he is participating in the program because his doctor recommended it. His stated goal was to get healthier and improve his energy. Lack of motivation could be a barrier for him to complete the program.    Expected Outcomes Short Term: Patient will start the program and attend consistently. Long Term: Patient will complete the program meeting personal goals.    Continue Psychosocial Services  Follow up required by staff          Psychosocial Re-Evaluation:   Psychosocial Discharge (Final Psychosocial Re-Evaluation):   Vocational Rehabilitation: Provide vocational rehab assistance to qualifying candidates.   Vocational Rehab Evaluation & Intervention:  Vocational Rehab - 11/08/23 1514       Initial Vocational Rehab Evaluation & Intervention   Assessment shows need for Vocational Rehabilitation No      Vocational Rehab Re-Evaulation   Comments Patient says he has not worked for several years due to burn out. He currently has no income.          Education: Education Goals: Education classes will be provided on a weekly basis, covering required topics. Participant will state understanding/return demonstration of topics presented.  Learning Barriers/Preferences:  Learning Barriers/Preferences - 11/08/23 1513       Learning Barriers/Preferences   Learning Barriers None    Learning Preferences Written Material;Skilled Demonstration          Education Topics: Hypertension, Hypertension Reduction -Define heart disease and high blood pressure. Discus how high blood pressure affects the body and ways to reduce high blood pressure.   Exercise and Your Heart -Discuss why it is important to exercise, the FITT principles of exercise, normal and abnormal responses to exercise, and how to exercise  safely.   Angina -Discuss definition of angina, causes of angina, treatment of angina, and how to decrease risk of having angina.   Cardiac Medications -Review what the following cardiac medications are used for, how they affect the body, and side effects that may occur when taking the medications.  Medications include Aspirin , Beta blockers, calcium  channel blockers, ACE Inhibitors, angiotensin receptor blockers, diuretics, digoxin, and antihyperlipidemics.   Congestive Heart Failure -Discuss the definition of CHF, how to live with CHF, the signs and symptoms of CHF, and how  keep track of weight and sodium intake.   Heart Disease and Intimacy -Discus the effect sexual activity has on the heart, how changes occur during intimacy as we age, and safety during sexual activity.   Smoking Cessation / COPD -Discuss different methods to quit smoking, the health benefits of quitting smoking, and the definition of COPD.   Nutrition I: Fats -Discuss the types of cholesterol, what cholesterol does to the heart, and how cholesterol levels can be controlled.   Nutrition II: Labels -Discuss the different components of food labels and how to read food label   Heart Parts/Heart Disease and PAD -Discuss the anatomy of the heart, the pathway of blood circulation through the heart, and these are affected by heart disease.   Stress I: Signs and Symptoms -Discuss the causes of stress, how stress may lead to anxiety and depression, and ways to limit stress.   Stress II: Relaxation -Discuss different types of relaxation techniques to limit stress.   Warning Signs of Stroke / TIA -Discuss definition of a stroke, what the signs and symptoms are of a stroke, and how to identify when someone is having stroke.   Knowledge Questionnaire Score:  Knowledge Questionnaire Score - 11/08/23 1443       Knowledge Questionnaire Score   Pre Score 25/28          Core Components/Risk Factors/Patient  Goals at Admission:  Personal Goals and Risk Factors at Admission - 11/08/23 1515       Core Components/Risk Factors/Patient Goals on Admission    Weight Management Weight Maintenance    Heart Failure Yes    Intervention Provide a combined exercise and nutrition program that is supplemented with education, support and counseling about heart failure. Directed toward relieving symptoms such as shortness of breath, decreased exercise tolerance, and extremity edema.    Expected Outcomes Improve functional capacity of life;Short term: Attendance in program 2-3 days a week with increased exercise capacity. Reported lower sodium intake. Reported increased fruit and vegetable intake. Reports medication compliance.;Short term: Daily weights obtained and reported for increase. Utilizing diuretic protocols set by physician.;Long term: Adoption of self-care skills and reduction of barriers for early signs and symptoms recognition and intervention leading to self-care maintenance.    Hypertension Yes    Intervention Monitor prescription use compliance.;Provide education on lifestyle modifcations including regular physical activity/exercise, weight management, moderate sodium restriction and increased consumption of fresh fruit, vegetables, and low fat dairy, alcohol moderation, and smoking cessation.    Expected Outcomes Short Term: Continued assessment and intervention until BP is < 140/64mm HG in hypertensive participants. < 130/56mm HG in hypertensive participants with diabetes, heart failure or chronic kidney disease.;Long Term: Maintenance of blood pressure at goal levels.    Lipids Yes    Intervention Provide education and support for participant on nutrition & aerobic/resistive exercise along with prescribed medications to achieve LDL 70mg , HDL >40mg .    Expected Outcomes Long Term: Cholesterol controlled with medications as prescribed, with individualized exercise RX and with personalized nutrition plan.  Value goals: LDL < 70mg , HDL > 40 mg.;Short Term: Participant states understanding of desired cholesterol values and is compliant with medications prescribed. Participant is following exercise prescription and nutrition guidelines.          Core Components/Risk Factors/Patient Goals Review:    Core Components/Risk Factors/Patient Goals at Discharge (Final Review):    ITP Comments:   Comments: Patient arrived for 1st visit/orientation/education at 1415. Patient was referred to CR by Dr. Rosalynn Dines from  [facility] due to Status Post CABGx3/NSTEMI. During orientation advised patient on arrival and appointment times what to wear, what to do before, during and after exercise. Reviewed attendance and class policy.  Pt is scheduled to return Cardiac Rehab on 11/13/23 at 1100. Pt was advised to come to class 15 minutes before class starts.  Discussed RPE/Dpysnea scales. Patient participated in warm up stretches. Patient was able to complete 6 minute walk test.  Telemetry:NSR with some PVC's. Patient was measured for the equipment. Discussed equipment safety with patient. Took patient pre-anthropometric measurements. Patient finished visit at 1520.

## 2023-11-13 ENCOUNTER — Encounter (HOSPITAL_COMMUNITY): Admission: RE | Admit: 2023-11-13 | Payer: MEDICAID | Source: Ambulatory Visit

## 2023-11-15 ENCOUNTER — Encounter (HOSPITAL_COMMUNITY)
Admission: RE | Admit: 2023-11-15 | Discharge: 2023-11-15 | Disposition: A | Discharge status: Home / Self Care | Payer: MEDICAID | Source: Ambulatory Visit | Attending: Thoracic Surgery | Admitting: Thoracic Surgery

## 2023-11-15 DIAGNOSIS — I214 Non-ST elevation (NSTEMI) myocardial infarction: Secondary | ICD-10-CM | POA: Diagnosis present

## 2023-11-15 DIAGNOSIS — Z951 Presence of aortocoronary bypass graft: Secondary | ICD-10-CM | POA: Insufficient documentation

## 2023-11-15 NOTE — Progress Notes (Signed)
 Daily Session Note  Patient Details  Name: Bradley Compton MRN: [id number] Date of Birth: 09-09-1970 Referring Provider:   Flowsheet Row CARDIAC REHAB PHASE II ORIENTATION from 11/08/2023 in [facility] CARDIAC REHABILITATION  Referring Provider Burnie Loving MD    Encounter Date: 11/15/2023  Check In:  Session Check In - 11/15/23 1046       Check-In   Supervising physician immediately available to respond to emergencies See telemetry face sheet for immediately available MD    Location [facility]-Cardiac & Pulmonary Re[facility]    Staff Present Harlene Gelineau, MA, RCEP, CCRP, Sueellen Louder, RN, BSN;Jamiyah Dingley Con, BS, Exercise Physiologist    Virtual Visit No    Medication changes reported     No    Fall or balance concerns reported    No    Tobacco Cessation No Change    Warm-up and Cool-down Performed on first and last piece of equipment    Resistance Training Performed Yes    VAD Patient? No    PAD/SET Patient? No      Pain Assessment   Currently in Pain? No/denies    Multiple Pain Sites No          Capillary Blood Glucose: No results found for this or any previous visit (from the past 24 hours).    Social History   Tobacco Use  Smoking Status Every Day   Current packs/day: 0.50   Average packs/day: 0.5 packs/day for 20.0 years (10.0 ttl pk-yrs)   Types: Cigarettes  Smokeless Tobacco Never    Goals Met:  Independence with exercise equipment Exercise tolerated well No report of concerns or symptoms today Strength training completed today  Goals Unmet:  Not Applicable  Comments: First full day of exercise!  Patient was oriented to gym and equipment including functions, settings, policies, and procedures.  Patient's individual exercise prescription and treatment plan were reviewed.  All starting workloads were established based on the results of the 6 minute walk test done at initial orientation visit.  The plan for exercise progression was also introduced and  progression will be customized based on patient's performance and goals.

## 2023-11-20 ENCOUNTER — Encounter (HOSPITAL_COMMUNITY)
Admission: RE | Admit: 2023-11-20 | Discharge: 2023-11-20 | Disposition: A | Discharge status: Home / Self Care | Payer: MEDICAID | Source: Ambulatory Visit | Attending: Thoracic Surgery | Admitting: Thoracic Surgery

## 2023-11-20 DIAGNOSIS — Z951 Presence of aortocoronary bypass graft: Secondary | ICD-10-CM | POA: Diagnosis not present

## 2023-11-20 DIAGNOSIS — I214 Non-ST elevation (NSTEMI) myocardial infarction: Secondary | ICD-10-CM

## 2023-11-20 NOTE — Progress Notes (Signed)
 Daily Session Note  Patient Details  Name: LIANDRO THELIN MRN: [id number] Date of Birth: 28-May-1970 Referring Provider:   Flowsheet Row CARDIAC REHAB PHASE II ORIENTATION from 11/08/2023 in [facility] CARDIAC REHABILITATION  Referring Provider Burnie Loving MD    Encounter Date: 11/20/2023  Check In:  Session Check In - 11/20/23 1051       Check-In   Supervising physician immediately available to respond to emergencies See telemetry face sheet for immediately available MD    Location [facility]    Staff Present Laymon Rattler, BSN, RN, WTA-C;Heather Con, BS, Exercise Physiologist    Virtual Visit No    Medication changes reported     No    Fall or balance concerns reported    No    Tobacco Cessation No Change    Warm-up and Cool-down Performed on first and last piece of equipment    Resistance Training Performed Yes    VAD Patient? No    PAD/SET Patient? No      Pain Assessment   Currently in Pain? No/denies          Capillary Blood Glucose: No results found for this or any previous visit (from the past 24 hours).    Social History   Tobacco Use  Smoking Status Every Day   Current packs/day: 0.50   Average packs/day: 0.5 packs/day for 20.0 years (10.0 ttl pk-yrs)   Types: Cigarettes  Smokeless Tobacco Never    Goals Met:  Independence with exercise equipment Exercise tolerated well No report of concerns or symptoms today Strength training completed today  Goals Unmet:  Not Applicable  Comments: Pt able to follow exercise prescription today without complaint.  Will continue to monitor for progression.

## 2023-11-22 ENCOUNTER — Telehealth (HOSPITAL_COMMUNITY): Payer: Self-pay

## 2023-11-22 ENCOUNTER — Encounter (HOSPITAL_COMMUNITY)
Admission: RE | Admit: 2023-11-22 | Discharge: 2023-11-22 | Disposition: A | Discharge status: Home / Self Care | Payer: MEDICAID | Source: Ambulatory Visit | Attending: Thoracic Surgery | Admitting: Thoracic Surgery

## 2023-11-22 NOTE — Telephone Encounter (Signed)
 Called regarding missed cardiac rehab session today. He said he had some GI issues this morning and was not able to attend. He may come Friday to make up the session, otherwise, plans to return Monday 11/27/23.

## 2023-11-27 ENCOUNTER — Encounter (HOSPITAL_COMMUNITY)
Admission: RE | Admit: 2023-11-27 | Discharge: 2023-11-27 | Disposition: A | Discharge status: Home / Self Care | Payer: MEDICAID | Source: Ambulatory Visit | Attending: Thoracic Surgery | Admitting: Thoracic Surgery

## 2023-11-27 DIAGNOSIS — Z951 Presence of aortocoronary bypass graft: Secondary | ICD-10-CM | POA: Diagnosis not present

## 2023-11-27 DIAGNOSIS — I214 Non-ST elevation (NSTEMI) myocardial infarction: Secondary | ICD-10-CM

## 2023-11-27 NOTE — Progress Notes (Signed)
 Daily Session Note  Patient Details  Name: Bradley Compton MRN: [id number] Date of Birth: 30-Aug-1970 Referring Provider:   Flowsheet Row CARDIAC REHAB PHASE II ORIENTATION from 11/08/2023 in [facility] CARDIAC REHABILITATION  Referring Provider Burnie Loving MD    Encounter Date: 11/27/2023  Check In:  Session Check In - 11/27/23 1100       Check-In   Supervising physician immediately available to respond to emergencies See telemetry face sheet for immediately available MD    Location [facility]    Staff Present Powell Benders, BS, Exercise Physiologist;Brittany Jackquline, BSN, RN, Rosalba Gelineau, MA, RCEP, CCRP, CCET    Virtual Visit No    Medication changes reported     No    Fall or balance concerns reported    No    Tobacco Cessation No Change    Warm-up and Cool-down Performed on first and last piece of equipment    Resistance Training Performed Yes    VAD Patient? No    PAD/SET Patient? No      Pain Assessment   Multiple Pain Sites No          Capillary Blood Glucose: No results found for this or any previous visit (from the past 24 hours).    Social History   Tobacco Use  Smoking Status Every Day   Current packs/day: 0.50   Average packs/day: 0.5 packs/day for 20.0 years (10.0 ttl pk-yrs)   Types: Cigarettes  Smokeless Tobacco Never    Goals Met:  Independence with exercise equipment Exercise tolerated well No report of concerns or symptoms today Strength training completed today  Goals Unmet:  Not Applicable  Comments: Pt able to follow exercise prescription today without complaint.  Will continue to monitor for progression.

## 2023-11-29 ENCOUNTER — Encounter (HOSPITAL_COMMUNITY)
Admission: RE | Admit: 2023-11-29 | Discharge: 2023-11-29 | Disposition: A | Discharge status: Home / Self Care | Payer: MEDICAID | Source: Ambulatory Visit | Attending: Thoracic Surgery | Admitting: Thoracic Surgery

## 2023-11-29 ENCOUNTER — Encounter (HOSPITAL_COMMUNITY): Payer: Self-pay | Admitting: *Deleted

## 2023-11-29 DIAGNOSIS — Z951 Presence of aortocoronary bypass graft: Secondary | ICD-10-CM

## 2023-11-29 DIAGNOSIS — I214 Non-ST elevation (NSTEMI) myocardial infarction: Secondary | ICD-10-CM

## 2023-11-29 NOTE — Progress Notes (Signed)
 Daily Session Note  Patient Details  Name: Bradley Compton MRN: [id number] Date of Birth: December 09, 1970 Referring Provider:   Flowsheet Row CARDIAC REHAB PHASE II ORIENTATION from 11/08/2023 in [facility] CARDIAC REHABILITATION  Referring Provider Burnie Loving MD    Encounter Date: 11/29/2023  Check In:  Session Check In - 11/29/23 1042       Check-In   Supervising physician immediately available to respond to emergencies See telemetry face sheet for immediately available MD    Location [facility]    Staff Present Powell Benders, BS, Exercise Physiologist;Brittany Jackquline, BSN, RN, Rosalba Gelineau, MA, RCEP, CCRP, CCET;Other    Virtual Visit No    Medication changes reported     No    Fall or balance concerns reported    No    Tobacco Cessation No Change    Warm-up and Cool-down Performed on first and last piece of equipment    Resistance Training Performed Yes    VAD Patient? No    PAD/SET Patient? No      Pain Assessment   Currently in Pain? No/denies    Multiple Pain Sites No          Capillary Blood Glucose: No results found for this or any previous visit (from the past 24 hours).    Social History   Tobacco Use  Smoking Status Every Day   Current packs/day: 0.50   Average packs/day: 0.5 packs/day for 20.0 years (10.0 ttl pk-yrs)   Types: Cigarettes  Smokeless Tobacco Never    Goals Met:  Independence with exercise equipment Exercise tolerated well No report of concerns or symptoms today Strength training completed today  Goals Unmet:  Not Applicable  Comments: Pt able to follow exercise prescription today without complaint.  Will continue to monitor for progression.

## 2023-11-29 NOTE — Progress Notes (Signed)
 Cardiac Individual Treatment Plan  Patient Details  Name: Bradley Compton MRN: [id number] Date of Birth: 11-08-1970 Referring Provider:   Flowsheet Row CARDIAC REHAB PHASE II ORIENTATION from 11/08/2023 in [facility] CARDIAC REHABILITATION  Referring Provider Burnie Loving MD    Initial Encounter Date:  Flowsheet Row CARDIAC REHAB PHASE II ORIENTATION from 11/08/2023 in [facility]  Date 11/08/23    Visit Diagnosis: NSTEMI (non-ST elevated myocardial infarction) (HCC)  S/P CABG x 3  Patient's Home Medications on Admission:  Current Outpatient Medications:    apixaban (ELIQUIS) 5 MG TABS tablet, Take 5 mg by mouth 2 (two) times daily., Disp: , Rfl:    aspirin  EC 81 MG tablet, Take 1 tablet (81 mg total) by mouth daily., Disp: , Rfl:    atorvastatin  (LIPITOR ) 80 MG tablet, Take 1 tablet (80 mg total) by mouth daily at 6 PM., Disp: 30 tablet, Rfl: 2   carvedilol  (COREG ) 3.125 MG tablet, Take 1 tablet (3.125 mg total) by mouth 2 (two) times daily with a meal. (Patient not taking: Reported on 11/08/2023), Disp: 60 tablet, Rfl: 2   losartan (COZAAR) 25 MG tablet, Take 12.5 mg by mouth daily., Disp: , Rfl:    metoprolol tartrate (LOPRESSOR) 25 MG tablet, Take 25 mg by mouth 2 (two) times daily., Disp: , Rfl:    Multiple Vitamin (MULTIVITAMIN ADULT PO), Take 1 tablet by mouth daily., Disp: , Rfl:    nitroGLYCERIN  (NITROSTAT ) 0.4 MG SL tablet, Place 1 tablet (0.4 mg total) under the tongue every 5 (five) minutes x 3 doses as needed for chest pain., Disp: 25 tablet, Rfl: 1   pantoprazole  (PROTONIX ) 40 MG tablet, Take 40 mg by mouth daily., Disp: , Rfl:    sacubitril -valsartan  (ENTRESTO ) 24-26 MG, Take 1 tablet by mouth 2 (two) times daily., Disp: 60 tablet, Rfl: 1   spironolactone (ALDACTONE) 25 MG tablet, Take 12.5 mg by mouth daily., Disp: , Rfl:    ticagrelor  (BRILINTA ) 90 MG TABS tablet, Take 1 tablet (90 mg total) by mouth 2 (two) times daily. (Patient not taking:  Reported on 11/08/2023), Disp: 60 tablet, Rfl: 2  Past Medical History: Past Medical History:  Diagnosis Date   Alcoholism (HCC)    CAD (coronary artery disease) 01/09/2018   Hx of MI in 2012 tx with DES to LAD, DES to RCA and DES to LCx at [facility] // Inf STEMI 5/19 >> LHC - dLM 50, pLAD 70, mLAD stent ok w/ 25 ISR, oLCx 35, mLCx stent ok, pRCA 95, mRCA 50, mRCA stent ok with 20 ISR, EF 25-35 >> PCI:  DES to prox RCA   Coronary artery disease    Xience V 2.5 x 15 mid LAD x 2, RCA Xience V 3.0 x 15 x 1, Xience V 3. x 23 mm x 1, 2012. High [facility]   GI bleed    Hyperlipidemia    Hypertension    Myocardial infarction [facility])    2012   Nephrolithiasis     Tobacco Use: Social History   Tobacco Use  Smoking Status Every Day   Current packs/day: 0.50   Average packs/day: 0.5 packs/day for 20.0 years (10.0 ttl pk-yrs)   Types: Cigarettes  Smokeless Tobacco Never    Labs: Review Flowsheet       Latest Ref Rng & Units 09/17/2017 09/18/2017 11/07/2017  Labs for ITP Cardiac and Pulmonary Rehab  Cholestrol 100 - 199 mg/dL 716  728  794   LDL (calc) 0 - 99  mg/dL 802  802  865   HDL-C >60 mg/dL 38  35  37   Trlycerides 0 - 149 mg/dL 760  805  831   TCO2 22 - 32 mmol/L 23  - -    Capillary Blood Glucose: Lab Results  Component Value Date   GLUCAP 103 (H) 05/03/2014   GLUCAP 103 (H) 05/02/2014   GLUCAP 76 05/02/2014   GLUCAP 119 (H) 05/01/2014   GLUCAP 74 05/01/2014     Exercise Target Goals: Exercise Program Goal: Individual exercise prescription set using results from initial 6 min walk test and THRR while considering  patient's activity barriers and safety.   Exercise Prescription Goal: Starting with aerobic activity 30 plus minutes a day, 3 days per week for initial exercise prescription. Provide home exercise prescription and guidelines that participant acknowledges understanding prior to discharge.  Activity Barriers & Risk Stratification:  Activity Barriers & Cardiac  Risk Stratification - 11/08/23 1429       Activity Barriers & Cardiac Risk Stratification   Activity Barriers None    Cardiac Risk Stratification High          6 Minute Walk:  6 Minute Walk     Row Name 11/08/23 1538         6 Minute Walk   Phase Initial     Distance 1330 feet     Walk Time 6 minutes     # of Rest Breaks 0     MPH 2.52     METS 4.14     RPE 12     VO2 Peak 14.5     Symptoms No     Resting HR 75 bpm     Resting BP 118/68     Resting Oxygen Saturation  96 %     Exercise Oxygen Saturation  during 6 min walk 96 %     Max Ex. HR 81 bpm     Max Ex. BP 130/70     2 Minute Post BP 120/68        Oxygen Initial Assessment:   Oxygen Re-Evaluation:   Oxygen Discharge (Final Oxygen Re-Evaluation):   Initial Exercise Prescription:  Initial Exercise Prescription - 11/08/23 1500       Date of Initial Exercise RX and Referring Provider   Date 11/08/23    Referring Provider Burnie Loving MD      Treadmill   MPH 2    Grade 0.5    Minutes 15    METs 2.67      REL-XR   Level 2    Speed 50    Minutes 15    METs 1.9      Prescription Details   Frequency (times per week) 2    Duration Progress to 30 minutes of continuous aerobic without signs/symptoms of physical distress      Intensity   THRR 40-80% of Max Heartrate 112-148    Ratings of Perceived Exertion 11-13    Perceived Dyspnea 0-4      Resistance Training   Training Prescription Yes    Weight 4    Reps 10-15          Perform Capillary Blood Glucose checks as needed.  Exercise Prescription Changes:   Exercise Prescription Changes     Row Name 11/15/23 1300             Response to Exercise   Blood Pressure (Admit) 124/62       Blood Pressure (Exercise) 150/70  Blood Pressure (Exit) 108/67       Heart Rate (Admit) 72 bpm       Heart Rate (Exercise) 92 bpm       Heart Rate (Exit) 81 bpm       Rating of Perceived Exertion (Exercise) 12       Duration Continue  with 30 min of aerobic exercise without signs/symptoms of physical distress.       Intensity THRR unchanged         Progression   Progression Continue to progress workloads to maintain intensity without signs/symptoms of physical distress.         Resistance Training   Training Prescription Yes       Weight 4       Reps 10-15         Treadmill   MPH 2       Grade 0.5       Minutes 15       METs 2.67         REL-XR   Level 2       Speed 50       Minutes 15       METs 4.1          Exercise Comments:   Exercise Comments     Row Name 11/15/23 1048           Exercise Comments First full day of exercise!  Patient was oriented to gym and equipment including functions, settings, policies, and procedures.  Patient's individual exercise prescription and treatment plan were reviewed.  All starting workloads were established based on the results of the 6 minute walk test done at initial orientation visit.  The plan for exercise progression was also introduced and progression will be customized based on patient's performance and goals.          Exercise Goals and Review:   Exercise Goals     Row Name 11/08/23 1540             Exercise Goals   Increase Physical Activity Yes       Intervention Provide advice, education, support and counseling about physical activity/exercise needs.;Develop an individualized exercise prescription for aerobic and resistive training based on initial evaluation findings, risk stratification, comorbidities and participant's personal goals.       Expected Outcomes Short Term: Attend rehab on a regular basis to increase amount of physical activity.;Long Term: Add in home exercise to make exercise part of routine and to increase amount of physical activity.;Long Term: Exercising regularly at least 3-5 days a week.       Increase Strength and Stamina Yes       Intervention Provide advice, education, support and counseling about physical activity/exercise  needs.;Develop an individualized exercise prescription for aerobic and resistive training based on initial evaluation findings, risk stratification, comorbidities and participant's personal goals.       Expected Outcomes Short Term: Increase workloads from initial exercise prescription for resistance, speed, and METs.;Short Term: Perform resistance training exercises routinely during rehab and add in resistance training at home;Long Term: Improve cardiorespiratory fitness, muscular endurance and strength as measured by increased METs and functional capacity ( )       Able to understand and use rate of perceived exertion (RPE) scale Yes       Intervention Provide education and explanation on how to use RPE scale       Expected Outcomes Short Term: Able to use RPE daily in rehab to  express subjective intensity level;Long Term:  Able to use RPE to guide intensity level when exercising independently       Able to understand and use Dyspnea scale Yes       Intervention Provide education and explanation on how to use Dyspnea scale       Expected Outcomes Short Term: Able to use Dyspnea scale daily in rehab to express subjective sense of shortness of breath during exertion;Long Term: Able to use Dyspnea scale to guide intensity level when exercising independently       Knowledge and understanding of Target Heart Rate Range (THRR) Yes       Intervention Provide education and explanation of THRR including how the numbers were predicted and where they are located for reference       Expected Outcomes Short Term: Able to state/look up THRR;Long Term: Able to use THRR to govern intensity when exercising independently;Short Term: Able to use daily as guideline for intensity in rehab       Able to check pulse independently Yes       Intervention Provide education and demonstration on how to check pulse in carotid and radial arteries.;Review the importance of being able to check your own pulse for safety during  independent exercise       Expected Outcomes Long Term: Able to check pulse independently and accurately;Short Term: Able to explain why pulse checking is important during independent exercise       Understanding of Exercise Prescription Yes       Intervention Provide education, explanation, and written materials on patient's individual exercise prescription       Expected Outcomes Short Term: Able to explain program exercise prescription;Long Term: Able to explain home exercise prescription to exercise independently          Exercise Goals Re-Evaluation :  Exercise Goals Re-Evaluation     Row Name 11/15/23 1048             Exercise Goal Re-Evaluation   Exercise Goals Review Knowledge and understanding of Target Heart Rate Range (THRR);Able to understand and use rate of perceived exertion (RPE) scale       Comments Reviewed RPE and dyspnea scale, THR and program prescription with pt today.  Pt voiced understanding and was given a copy of goals to take home.       Expected Outcomes Short: Use RPE daily to regulate intensity.  Long: Follow program prescription in THR.           Discharge Exercise Prescription (Final Exercise Prescription Changes):  Exercise Prescription Changes - 11/15/23 1300       Response to Exercise   Blood Pressure (Admit) 124/62    Blood Pressure (Exercise) 150/70    Blood Pressure (Exit) 108/67    Heart Rate (Admit) 72 bpm    Heart Rate (Exercise) 92 bpm    Heart Rate (Exit) 81 bpm    Rating of Perceived Exertion (Exercise) 12    Duration Continue with 30 min of aerobic exercise without signs/symptoms of physical distress.    Intensity THRR unchanged      Progression   Progression Continue to progress workloads to maintain intensity without signs/symptoms of physical distress.      Resistance Training   Training Prescription Yes    Weight 4    Reps 10-15      Treadmill   MPH 2    Grade 0.5    Minutes 15    METs 2.67  REL-XR   Level 2     Speed 50    Minutes 15    METs 4.1          Nutrition:  Target Goals: Understanding of nutrition guidelines, daily intake of sodium 1500mg , cholesterol 200mg , calories 30% from fat and 7% or less from saturated fats, daily to have 5 or more servings of fruits and vegetables.  Biometrics:  Pre Biometrics - 11/08/23 1541       Pre Biometrics   Height 5' 8 (1.727 m)    Weight 132 lb 11.5 oz (60.2 kg)    Waist Circumference 32 inches    Hip Circumference 35 inches    Waist to Hip Ratio 0.91 %    BMI (Calculated) 20.18    Grip Strength 40.8 kg           Nutrition Therapy Plan and Nutrition Goals:   Nutrition Assessments:  MEDIFICTS Score Key: >=70 Need to make dietary changes  40-70 Heart Healthy Diet <= 40 Therapeutic Level Cholesterol Diet  Flowsheet Row CARDIAC REHAB PHASE II ORIENTATION from 11/08/2023 in [facility] CARDIAC REHABILITATION  Picture Your Plate Total Score on Admission 58   Picture Your Plate Scores: <59 Unhealthy dietary pattern with much room for improvement. 41-50 Dietary pattern unlikely to meet recommendations for good health and room for improvement. 51-60 More healthful dietary pattern, with some room for improvement.  >60 Healthy dietary pattern, although there may be some specific behaviors that could be improved.    Nutrition Goals Re-Evaluation:   Nutrition Goals Discharge (Final Nutrition Goals Re-Evaluation):   Psychosocial: Target Goals: Acknowledge presence or absence of significant depression and/or stress, maximize coping skills, provide positive support system. Participant is able to verbalize types and ability to use techniques and skills needed for reducing stress and depression.  Initial Review & Psychosocial Screening:  Initial Psych Review & Screening - 11/08/23 1515       Initial Review   Current issues with Current Anxiety/Panic;Current Sleep Concerns      Family Dynamics   Good Support System? Yes       Barriers   Psychosocial barriers to participate in program The patient should benefit from training in stress management and relaxation.      Screening Interventions   Interventions Encouraged to exercise;Provide feedback about the scores to participant    Expected Outcomes Short Term goal: Utilizing psychosocial counselor, staff and physician to assist with identification of specific Stressors or current issues interfering with healing process. Setting desired goal for each stressor or current issue identified.;Long Term Goal: Stressors or current issues are controlled or eliminated.;Short Term goal: Identification and review with participant of any Quality of Life or Depression concerns found by scoring the questionnaire.;Long Term goal: The participant improves quality of Life and PHQ9 Scores as seen by post scores and/or verbalization of changes          Quality of Life Scores:  Quality of Life - 11/08/23 1541       Quality of Life   Select Quality of Life      Quality of Life Scores   Health/Function Pre 16.23 %    Socioeconomic Pre 13.64 %    Psych/Spiritual Pre 17.07 %    Family Pre 12.5 %    GLOBAL Pre 15.5 %         Scores of 19 and below usually indicate a poorer quality of life in these areas.  A difference of  2-3 points is a clinically  meaningful difference.  A difference of 2-3 points in the total score of the Quality of Life Index has been associated with significant improvement in overall quality of life, self-image, physical symptoms, and general health in studies assessing change in quality of life.  PHQ-9: Review Flowsheet       11/08/2023  Depression screen PHQ 2/9  Decreased Interest 2  Down, Depressed, Hopeless 1  PHQ - 2 Score 3  Altered sleeping 1  Tired, decreased energy 1  Change in appetite 1  Feeling bad or failure about yourself  1  Moving slowly or fidgety/restless 2  Suicidal thoughts 0  PHQ-9 Score 9  Difficult doing work/chores Somewhat  difficult   Interpretation of Total Score  Total Score Depression Severity:  1-4 = Minimal depression, 5-9 = Mild depression, 10-14 = Moderate depression, 15-19 = Moderately severe depression, 20-27 = Severe depression   Psychosocial Evaluation and Intervention:  Psychosocial Evaluation - 11/08/23 1515       Psychosocial Evaluation & Interventions   Interventions Encouraged to exercise with the program and follow exercise prescription;Relaxation education;Stress management education    Comments Patient was referred to CR with CABGx3 and NSTEMI from [facility]. He says he has had a history of multiple MI's with stents. His initial PHQ-9 score was 9. He says he has a history of anxiety which has been treated in the past with therapy and antidepressants. He stopped the medication due to decreased libido. He denies depression but says his girlfriend says he is depressed. He has not worked for several years due to burn out. He moved in with his girlfriend after his surgery. She is his only support person. He says he is participating in the program because his doctor recommended it. His stated goal was to get healthier and improve his energy. Lack of motivation could be a barrier for him to complete the program.    Expected Outcomes Short Term: Patient will start the program and attend consistently. Long Term: Patient will complete the program meeting personal goals.    Continue Psychosocial Services  Follow up required by staff          Psychosocial Re-Evaluation:   Psychosocial Discharge (Final Psychosocial Re-Evaluation):   Vocational Rehabilitation: Provide vocational rehab assistance to qualifying candidates.   Vocational Rehab Evaluation & Intervention:  Vocational Rehab - 11/08/23 1514       Initial Vocational Rehab Evaluation & Intervention   Assessment shows need for Vocational Rehabilitation No      Vocational Rehab Re-Evaulation   Comments Patient says he has not worked  for several years due to burn out. He currently has no income.          Education: Education Goals: Education classes will be provided on a weekly basis, covering required topics. Participant will state understanding/return demonstration of topics presented.  Learning Barriers/Preferences:  Learning Barriers/Preferences - 11/08/23 1513       Learning Barriers/Preferences   Learning Barriers None    Learning Preferences Written Material;Skilled Demonstration          Education Topics: Hypertension, Hypertension Reduction -Define heart disease and high blood pressure. Discus how high blood pressure affects the body and ways to reduce high blood pressure.   Exercise and Your Heart -Discuss why it is important to exercise, the FITT principles of exercise, normal and abnormal responses to exercise, and how to exercise safely.   Angina -Discuss definition of angina, causes of angina, treatment of angina, and how to decrease risk of  having angina.   Cardiac Medications -Review what the following cardiac medications are used for, how they affect the body, and side effects that may occur when taking the medications.  Medications include Aspirin , Beta blockers, calcium  channel blockers, ACE Inhibitors, angiotensin receptor blockers, diuretics, digoxin, and antihyperlipidemics.   Congestive Heart Failure -Discuss the definition of CHF, how to live with CHF, the signs and symptoms of CHF, and how keep track of weight and sodium intake.   Heart Disease and Intimacy -Discus the effect sexual activity has on the heart, how changes occur during intimacy as we age, and safety during sexual activity. Flowsheet Row CARDIAC REHAB PHASE II EXERCISE from 11/15/2023 in [facility] C[facility]AC [facility]  Date 11/15/23  Educator jh  Instruction Review Code 1- Verbalizes Understanding    Smoking Cessation / COPD -Discuss different methods to quit smoking, the health benefits of quitting  smoking, and the definition of COPD.   Nutrition I: Fats -Discuss the types of cholesterol, what cholesterol does to the heart, and how cholesterol levels can be controlled.   Nutrition II: Labels -Discuss the different components of food labels and how to read food label   Heart Parts/Heart Disease and PAD -Discuss the anatomy of the heart, the pathway of blood circulation through the heart, and these are affected by heart disease.   Stress I: Signs and Symptoms -Discuss the causes of stress, how stress may lead to anxiety and depression, and ways to limit stress.   Stress II: Relaxation -Discuss different types of relaxation techniques to limit stress.   Warning Signs of Stroke / TIA -Discuss definition of a stroke, what the signs and symptoms are of a stroke, and how to identify when someone is having stroke.   Knowledge Questionnaire Score:  Knowledge Questionnaire Score - 11/08/23 1443       Knowledge Questionnaire Score   Pre Score 25/28          Core Components/Risk Factors/Patient Goals at Admission:  Personal Goals and Risk Factors at Admission - 11/08/23 1515       Core Components/Risk Factors/Patient Goals on Admission    Weight Management Weight Maintenance    Heart Failure Yes    Intervention Provide a combined exercise and nutrition program that is supplemented with education, support and counseling about heart failure. Directed toward relieving symptoms such as shortness of breath, decreased exercise tolerance, and extremity edema.    Expected Outcomes Improve functional capacity of life;Short term: Attendance in program 2-3 days a week with increased exercise capacity. Reported lower sodium intake. Reported increased fruit and vegetable intake. Reports medication compliance.;Short term: Daily weights obtained and reported for increase. Utilizing diuretic protocols set by physician.;Long term: Adoption of self-care skills and reduction of barriers for  early signs and symptoms recognition and intervention leading to self-care maintenance.    Hypertension Yes    Intervention Monitor prescription use compliance.;Provide education on lifestyle modifcations including regular physical activity/exercise, weight management, moderate sodium restriction and increased consumption of fresh fruit, vegetables, and low fat dairy, alcohol moderation, and smoking cessation.    Expected Outcomes Short Term: Continued assessment and intervention until BP is < 140/87mm HG in hypertensive participants. < 130/75mm HG in hypertensive participants with diabetes, heart failure or chronic kidney disease.;Long Term: Maintenance of blood pressure at goal levels.    Lipids Yes    Intervention Provide education and support for participant on nutrition & aerobic/resistive exercise along with prescribed medications to achieve LDL 70mg , HDL >40mg .    Expected  Outcomes Long Term: Cholesterol controlled with medications as prescribed, with individualized exercise RX and with personalized nutrition plan. Value goals: LDL < 70mg , HDL > 40 mg.;Short Term: Participant states understanding of desired cholesterol values and is compliant with medications prescribed. Participant is following exercise prescription and nutrition guidelines.          Core Components/Risk Factors/Patient Goals Review:    Core Components/Risk Factors/Patient Goals at Discharge (Final Review):    ITP Comments:  ITP Comments     Row Name 11/15/23 1047 11/22/23 1153 11/29/23 0851       ITP Comments First full day of exercise!  Patient was oriented to gym and equipment including functions, settings, policies, and procedures.  Patient's individual exercise prescription and treatment plan were reviewed.  All starting workloads were established based on the results of the 6 minute walk test done at initial orientation visit.  The plan for exercise progression was also introduced and progression will be  customized based on patient's performance and goals. Called regarding missed CR session today. He said he had some GI issues this morning as was not able to attend. He may come Friday to make up the session, otherwise, plans to return Monday. 30 day review completed. ITP sent to Dr. Dorn Ross, Medical Director of Cardiac Rehab. Continue with ITP unless changes are made by physician. New to program.        Comments: 30 day review

## 2023-12-04 ENCOUNTER — Encounter (HOSPITAL_COMMUNITY)
Admission: RE | Admit: 2023-12-04 | Discharge: 2023-12-04 | Disposition: A | Discharge status: Home / Self Care | Payer: MEDICAID | Source: Ambulatory Visit | Attending: Thoracic Surgery | Admitting: Thoracic Surgery

## 2023-12-04 DIAGNOSIS — I214 Non-ST elevation (NSTEMI) myocardial infarction: Secondary | ICD-10-CM

## 2023-12-04 DIAGNOSIS — Z951 Presence of aortocoronary bypass graft: Secondary | ICD-10-CM | POA: Diagnosis not present

## 2023-12-04 NOTE — Progress Notes (Signed)
 Daily Session Note  Patient Details  Name: Bradley Compton MRN: [id number] Date of Birth: Jul 27, 1970 Referring Provider:   Flowsheet Row CARDIAC REHAB PHASE II ORIENTATION from 11/08/2023 in [facility] CARDIAC REHABILITATION  Referring Provider Burnie Loving MD    Encounter Date: 12/04/2023  Check In:  Session Check In - 12/04/23 1103       Check-In   Supervising physician immediately available to respond to emergencies See telemetry face sheet for immediately available MD    Location [facility]    Staff Present Laymon Rattler, BSN, RN, WTA-C;Heather Con, BS, Exercise Physiologist;Laureen Delores, BS, RRT, CPFT    Virtual Visit No    Medication changes reported     No    Fall or balance concerns reported    No    Tobacco Cessation No Change    Warm-up and Cool-down Performed on first and last piece of equipment    Resistance Training Performed Yes    VAD Patient? No    PAD/SET Patient? No      Pain Assessment   Currently in Pain? No/denies          Capillary Blood Glucose: No results found for this or any previous visit (from the past 24 hours).    Social History   Tobacco Use  Smoking Status Every Day   Current packs/day: 0.50   Average packs/day: 0.5 packs/day for 20.0 years (10.0 ttl pk-yrs)   Types: Cigarettes  Smokeless Tobacco Never    Goals Met:  Independence with exercise equipment Exercise tolerated well No report of concerns or symptoms today Strength training completed today  Goals Unmet:  Not Applicable  Comments: Pt able to follow exercise prescription today without complaint.  Will continue to monitor for progression.

## 2023-12-06 ENCOUNTER — Encounter (HOSPITAL_COMMUNITY)
Admission: RE | Admit: 2023-12-06 | Discharge: 2023-12-06 | Disposition: A | Discharge status: Home / Self Care | Payer: MEDICAID | Source: Ambulatory Visit | Attending: Thoracic Surgery | Admitting: Thoracic Surgery

## 2023-12-06 DIAGNOSIS — Z951 Presence of aortocoronary bypass graft: Secondary | ICD-10-CM

## 2023-12-06 DIAGNOSIS — I214 Non-ST elevation (NSTEMI) myocardial infarction: Secondary | ICD-10-CM

## 2023-12-06 NOTE — Progress Notes (Signed)
 Daily Session Note  Patient Details  Name: Bradley Compton MRN: [id number] Date of Birth: 1971-05-10 Referring Provider:   Flowsheet Row CARDIAC REHAB PHASE II ORIENTATION from 11/08/2023 in [facility] CARDIAC REHABILITATION  Referring Provider Burnie Loving MD    Encounter Date: 12/06/2023  Check In:  Session Check In - 12/06/23 1037       Check-In   Supervising physician immediately available to respond to emergencies See telemetry face sheet for immediately available MD    Location [facility]    Staff Present Powell Benders, BS, Exercise Physiologist;Laureen Delores, BS, RRT, CPFT    Virtual Visit No    Medication changes reported     No    Fall or balance concerns reported    No    Tobacco Cessation No Change    Warm-up and Cool-down Performed on first and last piece of equipment    Resistance Training Performed Yes    VAD Patient? No    PAD/SET Patient? No      Pain Assessment   Currently in Pain? No/denies          Capillary Blood Glucose: No results found for this or any previous visit (from the past 24 hours).    Social History   Tobacco Use  Smoking Status Every Day   Current packs/day: 0.50   Average packs/day: 0.5 packs/day for 20.0 years (10.0 ttl pk-yrs)   Types: Cigarettes  Smokeless Tobacco Never    Goals Met:  Independence with exercise equipment Exercise tolerated well No report of concerns or symptoms today Strength training completed today  Goals Unmet:  Not Applicable  Comments: Pt able to follow exercise prescription today without complaint.  Will continue to monitor for progression.

## 2023-12-11 ENCOUNTER — Encounter (HOSPITAL_COMMUNITY)
Admission: RE | Admit: 2023-12-11 | Discharge: 2023-12-11 | Disposition: A | Discharge status: Home / Self Care | Payer: MEDICAID | Source: Ambulatory Visit | Attending: Thoracic Surgery | Admitting: Thoracic Surgery

## 2023-12-11 DIAGNOSIS — Z951 Presence of aortocoronary bypass graft: Secondary | ICD-10-CM

## 2023-12-11 DIAGNOSIS — I214 Non-ST elevation (NSTEMI) myocardial infarction: Secondary | ICD-10-CM

## 2023-12-11 NOTE — Progress Notes (Signed)
  Reviewed home exercise with pt today.  Pt plans to walk at home and use weights for exercise.  Reviewed THR, pulse, RPE, sign and symptoms, pulse oximetery and when to call 911 or MD.  Also discussed weather considerations and indoor options.  Pt voiced understanding.

## 2023-12-11 NOTE — Progress Notes (Signed)
 Daily Session Note  Patient Details  Name: Bradley Compton MRN: [id number] Date of Birth: [birth year] Referring Provider:   Flowsheet Row CARDIAC REHAB PHASE II ORIENTATION from 11/08/2023 in [facility] CARDIAC REHABILITATION  Referring Provider Burnie Loving MD    Encounter Date: 12/11/2023  Check In:  Session Check In - 12/11/23 1105       Check-In   Supervising physician immediately available to respond to emergencies See telemetry face sheet for immediately available MD    Location [facility]    Staff Present Laymon Rattler, BSN, RN, Rosalba Gelineau, MA, RCEP, CCRP, CCET    Virtual Visit No    Medication changes reported     No    Fall or balance concerns reported    No    Tobacco Cessation No Change    Warm-up and Cool-down Performed on first and last piece of equipment    Resistance Training Performed Yes    VAD Patient? No    PAD/SET Patient? No      Pain Assessment   Currently in Pain? No/denies          Capillary Blood Glucose: No results found for this or any previous visit (from the past 24 hours).    Social History   Tobacco Use  Smoking Status Every Day   Current packs/day: 0.50   Average packs/day: 0.5 packs/day for 20.0 years (10.0 ttl pk-yrs)   Types: Cigarettes  Smokeless Tobacco Never    Goals Met:  Independence with exercise equipment Exercise tolerated well No report of concerns or symptoms today Strength training completed today  Goals Unmet:  Not Applicable  Comments: Pt able to follow exercise prescription today without complaint.  Will continue to monitor for progression.

## 2023-12-11 NOTE — Progress Notes (Signed)
 Cardiac Individual Treatment Plan  Patient Details  Name: Bradley Compton MRN: [id number] Date of Birth: January 17, 1971 Referring Provider:   Flowsheet Row CARDIAC REHAB PHASE II ORIENTATION from 11/08/2023 in [facility] CARDIAC REHABILITATION  Referring Provider Burnie Loving MD    Initial Encounter Date:  Flowsheet Row CARDIAC REHAB PHASE II ORIENTATION from 11/08/2023 in [place] CARDIAC REHABILITATION  Date 11/08/23    Visit Diagnosis: NSTEMI (non-ST elevated myocardial infarction) (HCC)  S/P CABG x 3  Patient's Home Medications on Admission:  Current Outpatient Medications:    apixaban (ELIQUIS) 5 MG TABS tablet, Take 5 mg by mouth 2 (two) times daily., Disp: , Rfl:    aspirin  EC 81 MG tablet, Take 1 tablet (81 mg total) by mouth daily., Disp: , Rfl:    atorvastatin  (LIPITOR ) 80 MG tablet, Take 1 tablet (80 mg total) by mouth daily at 6 PM., Disp: 30 tablet, Rfl: 2   carvedilol  (COREG ) 3.125 MG tablet, Take 1 tablet (3.125 mg total) by mouth 2 (two) times daily with a meal. (Patient not taking: Reported on 11/08/2023), Disp: 60 tablet, Rfl: 2   losartan (COZAAR) 25 MG tablet, Take 12.5 mg by mouth daily., Disp: , Rfl:    metoprolol tartrate (LOPRESSOR) 25 MG tablet, Take 25 mg by mouth 2 (two) times daily., Disp: , Rfl:    Multiple Vitamin (MULTIVITAMIN ADULT PO), Take 1 tablet by mouth daily., Disp: , Rfl:    nitroGLYCERIN  (NITROSTAT ) 0.4 MG SL tablet, Place 1 tablet (0.4 mg total) under the tongue every 5 (five) minutes x 3 doses as needed for chest pain., Disp: 25 tablet, Rfl: 1   pantoprazole  (PROTONIX ) 40 MG tablet, Take 40 mg by mouth daily., Disp: , Rfl:    sacubitril -valsartan  (ENTRESTO ) 24-26 MG, Take 1 tablet by mouth 2 (two) times daily., Disp: 60 tablet, Rfl: 1   spironolactone (ALDACTONE) 25 MG tablet, Take 12.5 mg by mouth daily., Disp: , Rfl:    ticagrelor  (BRILINTA ) 90 MG TABS tablet, Take 1 tablet (90 mg total) by mouth 2 (two) times daily. (Patient not taking:  Reported on 11/08/2023), Disp: 60 tablet, Rfl: 2  Past Medical History: Past Medical History:  Diagnosis Date   Alcoholism (HCC)    CAD (coronary artery disease) 01/09/2018   Hx of MI in 2012 tx with DES to LAD, DES to RCA and DES to LCx at [facility] // Inf STEMI 5/19 >> LHC - dLM 50, pLAD 70, mLAD stent ok w/ 25 ISR, oLCx 35, mLCx stent ok, pRCA 95, mRCA 50, mRCA stent ok with 20 ISR, EF 25-35 >> PCI:  DES to prox RCA   Coronary artery disease    Xience V 2.5 x 15 mid LAD x 2, RCA Xience V 3.0 x 15 x 1, Xience V 3. x 23 mm x 1, 2012. [facility]   GI bleed    Hyperlipidemia    Hypertension    Myocardial infarction [facility])    2012   Nephrolithiasis     Tobacco Use: Social History   Tobacco Use  Smoking Status Every Day   Current packs/day: 0.50   Average packs/day: 0.5 packs/day for 20.0 years (10.0 ttl pk-yrs)   Types: Cigarettes  Smokeless Tobacco Never    Labs: Review Flowsheet       Latest Ref Rng & Units 09/17/2017 09/18/2017 11/07/2017  Labs for ITP Cardiac and Pulmonary Rehab  Cholestrol 100 - 199 mg/dL 716  728  794   LDL (calc) 0 - 99  mg/dL 802  802  865   HDL-C >60 mg/dL 38  35  37   Trlycerides 0 - 149 mg/dL 760  805  831   TCO2 22 - 32 mmol/L 23  - -    Capillary Blood Glucose: Lab Results  Component Value Date   GLUCAP 103 (H) 05/03/2014   GLUCAP 103 (H) 05/02/2014   GLUCAP 76 05/02/2014   GLUCAP 119 (H) 05/01/2014   GLUCAP 74 05/01/2014     Exercise Target Goals: Exercise Program Goal: Individual exercise prescription set using results from initial 6 min walk test and THRR while considering  patient's activity barriers and safety.   Exercise Prescription Goal: Starting with aerobic activity 30 plus minutes a day, 3 days per week for initial exercise prescription. Provide home exercise prescription and guidelines that participant acknowledges understanding prior to discharge.  Activity Barriers & Risk Stratification:  Activity Barriers & Cardiac  Risk Stratification - 11/08/23 1429       Activity Barriers & Cardiac Risk Stratification   Activity Barriers None    Cardiac Risk Stratification High          6 Minute Walk:  6 Minute Walk     Row Name 11/08/23 1538         6 Minute Walk   Phase Initial     Distance 1330 feet     Walk Time 6 minutes     # of Rest Breaks 0     MPH 2.52     METS 4.14     RPE 12     VO2 Peak 14.5     Symptoms No     Resting HR 75 bpm     Resting BP 118/68     Resting Oxygen Saturation  96 %     Exercise Oxygen Saturation  during 6 min walk 96 %     Max Ex. HR 81 bpm     Max Ex. BP 130/70     2 Minute Post BP 120/68        Oxygen Initial Assessment:   Oxygen Re-Evaluation:   Oxygen Discharge (Final Oxygen Re-Evaluation):   Initial Exercise Prescription:  Initial Exercise Prescription - 11/08/23 1500       Date of Initial Exercise RX and Referring Provider   Date 11/08/23    Referring Provider Burnie Loving MD      Treadmill   MPH 2    Grade 0.5    Minutes 15    METs 2.67      REL-XR   Level 2    Speed 50    Minutes 15    METs 1.9      Prescription Details   Frequency (times per week) 2    Duration Progress to 30 minutes of continuous aerobic without signs/symptoms of physical distress      Intensity   THRR 40-80% of Max Heartrate 112-148    Ratings of Perceived Exertion 11-13    Perceived Dyspnea 0-4      Resistance Training   Training Prescription Yes    Weight 4    Reps 10-15          Perform Capillary Blood Glucose checks as needed.  Exercise Prescription Changes:   Exercise Prescription Changes     Row Name 11/15/23 1300 12/04/23 1300           Response to Exercise   Blood Pressure (Admit) 124/62 110/70      Blood Pressure (Exercise) 150/70 130/70  Blood Pressure (Exit) 108/67 112/60      Heart Rate (Admit) 72 bpm 89 bpm      Heart Rate (Exercise) 92 bpm 113 bpm      Heart Rate (Exit) 81 bpm 76 bpm      Rating of Perceived  Exertion (Exercise) 12 13      Duration Continue with 30 min of aerobic exercise without signs/symptoms of physical distress. Continue with 30 min of aerobic exercise without signs/symptoms of physical distress.      Intensity THRR unchanged THRR unchanged        Progression   Progression Continue to progress workloads to maintain intensity without signs/symptoms of physical distress. Continue to progress workloads to maintain intensity without signs/symptoms of physical distress.        Resistance Training   Training Prescription Yes Yes      Weight 4 4      Reps 10-15 10-15        Treadmill   MPH 2 2.9      Grade 0.5 0      Minutes 15 15      METs 2.67 3.22        REL-XR   Level 2 5      Speed 50 52      Minutes 15 15      METs 4.1 4.1         Exercise Comments:   Exercise Comments     Row Name 11/15/23 1048           Exercise Comments First full day of exercise!  Patient was oriented to gym and equipment including functions, settings, policies, and procedures.  Patient's individual exercise prescription and treatment plan were reviewed.  All starting workloads were established based on the results of the 6 minute walk test done at initial orientation visit.  The plan for exercise progression was also introduced and progression will be customized based on patient's performance and goals.          Exercise Goals and Review:   Exercise Goals     Row Name 11/08/23 1540             Exercise Goals   Increase Physical Activity Yes       Intervention Provide advice, education, support and counseling about physical activity/exercise needs.;Develop an individualized exercise prescription for aerobic and resistive training based on initial evaluation findings, risk stratification, comorbidities and participant's personal goals.       Expected Outcomes Short Term: Attend rehab on a regular basis to increase amount of physical activity.;Long Term: Add in home exercise to  make exercise part of routine and to increase amount of physical activity.;Long Term: Exercising regularly at least 3-5 days a week.       Increase Strength and Stamina Yes       Intervention Provide advice, education, support and counseling about physical activity/exercise needs.;Develop an individualized exercise prescription for aerobic and resistive training based on initial evaluation findings, risk stratification, comorbidities and participant's personal goals.       Expected Outcomes Short Term: Increase workloads from initial exercise prescription for resistance, speed, and METs.;Short Term: Perform resistance training exercises routinely during rehab and add in resistance training at home;Long Term: Improve cardiorespiratory fitness, muscular endurance and strength as measured by increased METs and functional capacity ( )       Able to understand and use rate of perceived exertion (RPE) scale Yes       Intervention Provide education  and explanation on how to use RPE scale       Expected Outcomes Short Term: Able to use RPE daily in rehab to express subjective intensity level;Long Term:  Able to use RPE to guide intensity level when exercising independently       Able to understand and use Dyspnea scale Yes       Intervention Provide education and explanation on how to use Dyspnea scale       Expected Outcomes Short Term: Able to use Dyspnea scale daily in rehab to express subjective sense of shortness of breath during exertion;Long Term: Able to use Dyspnea scale to guide intensity level when exercising independently       Knowledge and understanding of Target Heart Rate Range (THRR) Yes       Intervention Provide education and explanation of THRR including how the numbers were predicted and where they are located for reference       Expected Outcomes Short Term: Able to state/look up THRR;Long Term: Able to use THRR to govern intensity when exercising independently;Short Term: Able to use  daily as guideline for intensity in rehab       Able to check pulse independently Yes       Intervention Provide education and demonstration on how to check pulse in carotid and radial arteries.;Review the importance of being able to check your own pulse for safety during independent exercise       Expected Outcomes Long Term: Able to check pulse independently and accurately;Short Term: Able to explain why pulse checking is important during independent exercise       Understanding of Exercise Prescription Yes       Intervention Provide education, explanation, and written materials on patient's individual exercise prescription       Expected Outcomes Short Term: Able to explain program exercise prescription;Long Term: Able to explain home exercise prescription to exercise independently          Exercise Goals Re-Evaluation :  Exercise Goals Re-Evaluation     Row Name 11/15/23 1048 12/11/23 1116           Exercise Goal Re-Evaluation   Exercise Goals Review Knowledge and understanding of Target Heart Rate Range (THRR);Able to understand and use rate of perceived exertion (RPE) scale Increase Strength and Stamina;Able to check pulse independently;Knowledge and understanding of Target Heart Rate Range (THRR);Understanding of Exercise Prescription;Increase Physical Activity      Comments Reviewed RPE and dyspnea scale, THR and program prescription with pt today.  Pt voiced understanding and was given a copy of goals to take home. Reviewed home exercise with pt today.  Pt plans to walk at home and use weights for exercise.  Reviewed THR, pulse, RPE, sign and symptoms, pulse oximetery and when to call 911 or MD.  Also discussed weather considerations and indoor options.  Pt voiced understanding.      Expected Outcomes Short: Use RPE daily to regulate intensity.  Long: Follow program prescription in THR. Short: Start to add in exercise at home Long: Conitnue to exercise independently           Discharge Exercise Prescription (Final Exercise Prescription Changes):  Exercise Prescription Changes - 12/04/23 1300       Response to Exercise   Blood Pressure (Admit) 110/70    Blood Pressure (Exercise) 130/70    Blood Pressure (Exit) 112/60    Heart Rate (Admit) 89 bpm    Heart Rate (Exercise) 113 bpm    Heart Rate (Exit)  76 bpm    Rating of Perceived Exertion (Exercise) 13    Duration Continue with 30 min of aerobic exercise without signs/symptoms of physical distress.    Intensity THRR unchanged      Progression   Progression Continue to progress workloads to maintain intensity without signs/symptoms of physical distress.      Resistance Training   Training Prescription Yes    Weight 4    Reps 10-15      Treadmill   MPH 2.9    Grade 0    Minutes 15    METs 3.22      REL-XR   Level 5    Speed 52    Minutes 15    METs 4.1          Nutrition:  Target Goals: Understanding of nutrition guidelines, daily intake of sodium 1500mg , cholesterol 200mg , calories 30% from fat and 7% or less from saturated fats, daily to have 5 or more servings of fruits and vegetables.  Biometrics:  Pre Biometrics - 11/08/23 1541       Pre Biometrics   Height 5' 8 (1.727 m)    Weight 60.2 kg    Waist Circumference 32 inches    Hip Circumference 35 inches    Waist to Hip Ratio 0.91 %    BMI (Calculated) 20.18    Grip Strength 40.8 kg           Nutrition Therapy Plan and Nutrition Goals:   Nutrition Assessments:  MEDIFICTS Score Key: >=70 Need to make dietary changes  40-70 Heart Healthy Diet <= 40 Therapeutic Level Cholesterol Diet  Flowsheet Row CARDIAC REHAB PHASE II ORIENTATION from 11/08/2023 in [facility] CARDIAC REHABILITATION  Picture Your Plate Total Score on Admission 58   Picture Your Plate Scores: <59 Unhealthy dietary pattern with much room for improvement. 41-50 Dietary pattern unlikely to meet recommendations for good health and room for  improvement. 51-60 More healthful dietary pattern, with some room for improvement.  >60 Healthy dietary pattern, although there may be some specific behaviors that could be improved.    Nutrition Goals Re-Evaluation:  Nutrition Goals Re-Evaluation     Row Name 12/11/23 1127             Goals   Nutrition Goal Heart healthy diet       Comment Bradley Compton is doing well in rehab.  He is trying to eat better and focus on healthy eating.  One of his roommates made lasangna last night and enjoyed it.  He is staying away from salt but does have some sweets more frequently.  He is snacking more frequently and we talked about reaching for healthy snacks. He misses the salt.       Expected Outcome Short: Aim for healthy snacks Long: Continue to work on diet.          Nutrition Goals Discharge (Final Nutrition Goals Re-Evaluation):  Nutrition Goals Re-Evaluation - 12/11/23 1127       Goals   Nutrition Goal Heart healthy diet    Comment Bradley Compton is doing well in rehab.  He is trying to eat better and focus on healthy eating.  One of his roommates made lasangna last night and enjoyed it.  He is staying away from salt but does have some sweets more frequently.  He is snacking more frequently and we talked about reaching for healthy snacks. He misses the salt.    Expected Outcome Short: Aim for healthy snacks Long: Continue to  work on diet.          Psychosocial: Target Goals: Acknowledge presence or absence of significant depression and/or stress, maximize coping skills, provide positive support system. Participant is able to verbalize types and ability to use techniques and skills needed for reducing stress and depression.  Initial Review & Psychosocial Screening:  Initial Psych Review & Screening - 11/08/23 1515       Initial Review   Current issues with Current Anxiety/Panic;Current Sleep Concerns      Family Dynamics   Good Support System? Yes      Barriers   Psychosocial barriers to  participate in program The patient should benefit from training in stress management and relaxation.      Screening Interventions   Interventions Encouraged to exercise;Provide feedback about the scores to participant    Expected Outcomes Short Term goal: Utilizing psychosocial counselor, staff and physician to assist with identification of specific Stressors or current issues interfering with healing process. Setting desired goal for each stressor or current issue identified.;Long Term Goal: Stressors or current issues are controlled or eliminated.;Short Term goal: Identification and review with participant of any Quality of Life or Depression concerns found by scoring the questionnaire.;Long Term goal: The participant improves quality of Life and PHQ9 Scores as seen by post scores and/or verbalization of changes          Quality of Life Scores:  Quality of Life - 11/08/23 1541       Quality of Life   Select Quality of Life      Quality of Life Scores   Health/Function Pre 16.23 %    Socioeconomic Pre 13.64 %    Psych/Spiritual Pre 17.07 %    Family Pre 12.5 %    GLOBAL Pre 15.5 %         Scores of 19 and below usually indicate a poorer quality of life in these areas.  A difference of  2-3 points is a clinically meaningful difference.  A difference of 2-3 points in the total score of the Quality of Life Index has been associated with significant improvement in overall quality of life, self-image, physical symptoms, and general health in studies assessing change in quality of life.  PHQ-9: Review Flowsheet       12/11/2023 11/08/2023  Depression screen PHQ 2/9  Decreased Interest 1 2  Down, Depressed, Hopeless 0 1  PHQ - 2 Score 1 3  Altered sleeping 0 1  Tired, decreased energy 1 1  Change in appetite 0 1  Feeling bad or failure about yourself  0 1  Trouble concentrating 1 -  Moving slowly or fidgety/restless 1 2  Suicidal thoughts 0 0  PHQ-9 Score 4 9  Difficult doing  work/chores Somewhat difficult Somewhat difficult   Interpretation of Total Score  Total Score Depression Severity:  1-4 = Minimal depression, 5-9 = Mild depression, 10-14 = Moderate depression, 15-19 = Moderately severe depression, 20-27 = Severe depression   Psychosocial Evaluation and Intervention:  Psychosocial Evaluation - 11/08/23 1515       Psychosocial Evaluation & Interventions   Interventions Encouraged to exercise with the program and follow exercise prescription;Relaxation education;Stress management education    Comments Patient was referred to CR with CABGx3 and NSTEMI from [facility]. He says he has had a history of multiple MI's with stents. His initial PHQ-9 score was 9. He says he has a history of anxiety which has been treated in the past with therapy and antidepressants. He  stopped the medication due to decreased libido. He denies depression but says his girlfriend says he is depressed. He has not worked for several years due to burn out. He moved in with his girlfriend after his surgery. She is his only support person. He says he is participating in the program because his doctor recommended it. His stated goal was to get healthier and improve his energy. Lack of motivation could be a barrier for him to complete the program.    Expected Outcomes Short Term: Patient will start the program and attend consistently. Long Term: Patient will complete the program meeting personal goals.    Continue Psychosocial Services  Follow up required by staff          Psychosocial Re-Evaluation:  Psychosocial Re-Evaluation     Row Name 12/11/23 1120             Psychosocial Re-Evaluation   Current issues with Current Stress Concerns       Comments Bradley Compton has just moved to [place].  He is now in a second floor apartment.  Other than the move, he denies any other major stressors.  He usually sleeps pretty well and has some days where he feels like he wants to sleep more.  His  PHQ score has improved from 9 down to 4.  He also has a PCP visit coming up. He has tried zoloft before and it did not work, but currently not on anything.  He is also planning to go back to work and trying to find a job soon.       Expected Outcomes Short: Talk to doctor about symptoms and if need to renew Long: Get transferred       Interventions Stress management education;Encouraged to attend Cardiac Rehabilitation for the exercise       Continue Psychosocial Services  Follow up required by staff          Psychosocial Discharge (Final Psychosocial Re-Evaluation):  Psychosocial Re-Evaluation - 12/11/23 1120       Psychosocial Re-Evaluation   Current issues with Current Stress Concerns    Comments Bradley Compton has just moved to [place].  He is now in a second floor apartment.  Other than the move, he denies any other major stressors.  He usually sleeps pretty well and has some days where he feels like he wants to sleep more.  His PHQ score has improved from 9 down to 4.  He also has a PCP visit coming up. He has tried zoloft before and it did not work, but currently not on anything.  He is also planning to go back to work and trying to find a job soon.    Expected Outcomes Short: Talk to doctor about symptoms and if need to renew Long: Get transferred    Interventions Stress management education;Encouraged to attend Cardiac Rehabilitation for the exercise    Continue Psychosocial Services  Follow up required by staff          Vocational Rehabilitation: Provide vocational rehab assistance to qualifying candidates.   Vocational Rehab Evaluation & Intervention:  Vocational Rehab - 11/08/23 1514       Initial Vocational Rehab Evaluation & Intervention   Assessment shows need for Vocational Rehabilitation No      Vocational Rehab Re-Evaulation   Comments Patient says he has not worked for several years due to burn out. He currently has no income.          Education: Education Goals:  Education  classes will be provided on a weekly basis, covering required topics. Participant will state understanding/return demonstration of topics presented.  Learning Barriers/Preferences:  Learning Barriers/Preferences - 11/08/23 1513       Learning Barriers/Preferences   Learning Barriers None    Learning Preferences Written Material;Skilled Demonstration          Education Topics: Hypertension, Hypertension Reduction -Define heart disease and high blood pressure. Discus how high blood pressure affects the body and ways to reduce high blood pressure. Flowsheet Row CARDIAC REHAB PHASE II EXERCISE from 12/06/2023 in [place] [facility] CARDIAC REHABILITATION  Date 12/06/23  Educator jh  Instruction Review Code 1- Verbalizes Understanding    Exercise and Your Heart -Discuss why it is important to exercise, the FITT principles of exercise, normal and abnormal responses to exercise, and how to exercise safely.   Angina -Discuss definition of angina, causes of angina, treatment of angina, and how to decrease risk of having angina.   Cardiac Medications -Review what the following cardiac medications are used for, how they affect the body, and side effects that may occur when taking the medications.  Medications include Aspirin , Beta blockers, calcium  channel blockers, ACE Inhibitors, angiotensin receptor blockers, diuretics, digoxin, and antihyperlipidemics.   Congestive Heart Failure -Discuss the definition of CHF, how to live with CHF, the signs and symptoms of CHF, and how keep track of weight and sodium intake. Flowsheet Row CARDIAC REHAB PHASE II EXERCISE from 12/06/2023 in [place] CARDIAC REHABILITATION  Date 12/06/23  Educator jh  Instruction Review Code 1- Verbalizes Understanding    Heart Disease and Intimacy -Discus the effect sexual activity has on the heart, how changes occur during intimacy as we age, and safety during sexual activity. Flowsheet Row CARDIAC REHAB PHASE  II EXERCISE from 12/06/2023 in [place] CARDIAC RE[facility]ITATION  Date 11/15/23  Educator jh  Instruction Review Code 1- Verbalizes Understanding    Smoking Cessation / COPD -Discuss different methods to quit smoking, the health benefits of quitting smoking, and the definition of COPD.   Nutrition I: Fats -Discuss the types of cholesterol, what cholesterol does to the heart, and how cholesterol levels can be controlled.   Nutrition II: Labels -Discuss the different components of food labels and how to read food label   Heart Parts/Heart Disease and PAD -Discuss the anatomy of the heart, the pathway of blood circulation through the heart, and these are affected by heart disease.   Stress I: Signs and Symptoms -Discuss the causes of stress, how stress may lead to anxiety and depression, and ways to limit stress.   Stress II: Relaxation -Discuss different types of relaxation techniques to limit stress. Flowsheet Row CARDIAC REHAB PHASE II EXERCISE from 12/06/2023 in [place] [facility]  Date 11/29/23  Educator jh  Instruction Review Code 1- Verbalizes Understanding    Warning Signs of Stroke / TIA -Discuss definition of a stroke, what the signs and symptoms are of a stroke, and how to identify when someone is having stroke.   Knowledge Questionnaire Score:  Knowledge Questionnaire Score - 11/08/23 1443       Knowledge Questionnaire Score   Pre Score 25/28          Core Components/Risk Factors/Patient Goals at Admission:  Personal Goals and Risk Factors at Admission - 11/08/23 1515       Core Components/Risk Factors/Patient Goals on Admission    Weight Management Weight Maintenance    Heart Failure Yes    Intervention Provide a combined exercise and  nutrition program that is supplemented with education, support and counseling about heart failure. Directed toward relieving symptoms such as shortness of breath, decreased exercise tolerance, and extremity  edema.    Expected Outcomes Improve functional capacity of life;Short term: Attendance in program 2-3 days a week with increased exercise capacity. Reported lower sodium intake. Reported increased fruit and vegetable intake. Reports medication compliance.;Short term: Daily weights obtained and reported for increase. Utilizing diuretic protocols set by physician.;Long term: Adoption of self-care skills and reduction of barriers for early signs and symptoms recognition and intervention leading to self-care maintenance.    Hypertension Yes    Intervention Monitor prescription use compliance.;Provide education on lifestyle modifcations including regular physical activity/exercise, weight management, moderate sodium restriction and increased consumption of fresh fruit, vegetables, and low fat dairy, alcohol moderation, and smoking cessation.    Expected Outcomes Short Term: Continued assessment and intervention until BP is < 140/68mm HG in hypertensive participants. < 130/28mm HG in hypertensive participants with diabetes, heart failure or chronic kidney disease.;Long Term: Maintenance of blood pressure at goal levels.    Lipids Yes    Intervention Provide education and support for participant on nutrition & aerobic/resistive exercise along with prescribed medications to achieve LDL 70mg , HDL >40mg .    Expected Outcomes Long Term: Cholesterol controlled with medications as prescribed, with individualized exercise RX and with personalized nutrition plan. Value goals: LDL < 70mg , HDL > 40 mg.;Short Term: Participant states understanding of desired cholesterol values and is compliant with medications prescribed. Participant is following exercise prescription and nutrition guidelines.          Core Components/Risk Factors/Patient Goals Review:   Goals and Risk Factor Review     Row Name 12/11/23 1130             Core Components/Risk Factors/Patient Goals Review   Personal Goals Review Weight  Management/Obesity;Hypertension;Lipids;Heart Failure       Review Bradley Compton is doing well in rehab.  His weight is staying steady. His pressures are doing well in class.  He has not been checking them at home as much now that he has moved. He plans to stop by [facility] to check while out for his walk.  He is watching his sodium and denies any symptoms.       Expected Outcomes Short: Continue to keep eye on blood pressure Long: Continue to montior risk factors.          Core Components/Risk Factors/Patient Goals at Discharge (Final Review):   Goals and Risk Factor Review - 12/11/23 1130       Core Components/Risk Factors/Patient Goals Review   Personal Goals Review Weight Management/Obesity;Hypertension;Lipids;Heart Failure    Review Bradley Compton is doing well in rehab.  His weight is staying steady. His pressures are doing well in class.  He has not been checking them at home as much now that he has moved. He plans to stop by [facility] to check while out for his walk.  He is watching his sodium and denies any symptoms.    Expected Outcomes Short: Continue to keep eye on blood pressure Long: Continue to montior risk factors.          ITP Comments:  ITP Comments     Row Name 11/15/23 1047 11/22/23 1153 11/29/23 0851 12/11/23 1117     ITP Comments First full day of exercise!  Patient was oriented to gym and equipment including functions, settings, policies, and procedures.  Patient's individual exercise prescription and treatment plan were reviewed.  All  starting workloads were established based on the results of the 6 minute walk test done at initial orientation visit.  The plan for exercise progression was also introduced and progression will be customized based on patient's performance and goals. Called regarding missed CR session today. He said he had some GI issues this morning as was not able to attend. He may come Friday to make up the session, otherwise, plans to return Monday. 30 day review  completed. ITP sent to Dr. Dorn Ross, Medical Director of Cardiac Rehab. Continue with ITP unless changes are made by physician. New to program. Bradley Compton has moved to [facility] and would like transfer his cardiac rehab to [facility].  We will reach out to them to transfer his care.       Comments: Generated for Transfer

## 2023-12-13 ENCOUNTER — Encounter (HOSPITAL_COMMUNITY)
Admission: RE | Admit: 2023-12-13 | Discharge: 2023-12-13 | Disposition: A | Discharge status: Home / Self Care | Payer: MEDICAID | Source: Ambulatory Visit | Attending: Thoracic Surgery | Admitting: Thoracic Surgery

## 2023-12-13 DIAGNOSIS — Z951 Presence of aortocoronary bypass graft: Secondary | ICD-10-CM

## 2023-12-13 DIAGNOSIS — I214 Non-ST elevation (NSTEMI) myocardial infarction: Secondary | ICD-10-CM

## 2023-12-13 NOTE — Progress Notes (Signed)
 Daily Session Note  Patient Details  Name: Bradley Compton MRN: [id number] Date of Birth: 1971/01/01 Referring Provider:   Flowsheet Row CARDIAC REHAB PHASE II ORIENTATION from 11/08/2023 in [facility]  Referring Provider Burnie Loving MD    Encounter Date: 12/13/2023  Check In:  Session Check In - 12/13/23 1058       Check-In   Supervising physician immediately available to respond to emergencies See telemetry face sheet for immediately available MD    Location [facility]    Staff Present Laymon Rattler, BSN, RN, Rosalba Gelineau, MA, RCEP, CCRP, CCET    Virtual Visit No    Medication changes reported     No    Fall or balance concerns reported    No    Tobacco Cessation No Change    Warm-up and Cool-down Performed on first and last piece of equipment    Resistance Training Performed Yes    VAD Patient? No    PAD/SET Patient? No      Pain Assessment   Currently in Pain? No/denies          Capillary Blood Glucose: No results found for this or any previous visit (from the past 24 hours).    Social History   Tobacco Use  Smoking Status Every Day   Current packs/day: 0.50   Average packs/day: 0.5 packs/day for 20.0 years (10.0 ttl pk-yrs)   Types: Cigarettes  Smokeless Tobacco Never    Goals Met:  Independence with exercise equipment Exercise tolerated well No report of concerns or symptoms today Strength training completed today  Goals Unmet:  Not Applicable  Comments: Pt able to follow exercise prescription today without complaint.  Will continue to monitor for progression.

## 2023-12-18 ENCOUNTER — Encounter (HOSPITAL_COMMUNITY)
Admission: RE | Admit: 2023-12-18 | Discharge: 2023-12-18 | Disposition: A | Discharge status: Home / Self Care | Payer: MEDICAID | Source: Ambulatory Visit | Attending: Thoracic Surgery | Admitting: Thoracic Surgery

## 2023-12-18 DIAGNOSIS — Z951 Presence of aortocoronary bypass graft: Secondary | ICD-10-CM | POA: Insufficient documentation

## 2023-12-18 DIAGNOSIS — I214 Non-ST elevation (NSTEMI) myocardial infarction: Secondary | ICD-10-CM | POA: Insufficient documentation

## 2023-12-19 NOTE — Progress Notes (Signed)
 Cardiac Individual Treatment Plan  Patient Details  Name: Bradley Compton MRN: [id number] Date of Birth: [birth year] Referring Provider:   Flowsheet Row CARDIAC REHAB PHASE II ORIENTATION from 11/08/2023 in [facility] CARDIAC REHABILITATION  Referring Provider Burnie Loving MD    Initial Encounter Date:  Flowsheet Row CARDIAC REHAB PHASE II ORIENTATION from 11/08/2023 in [place] CARDIAC REHABILITATION  Date 11/08/23    Visit Diagnosis: No diagnosis found.  Patient's Home Medications on Admission:  Current Outpatient Medications:    apixaban (ELIQUIS) 5 MG TABS tablet, Take 5 mg by mouth 2 (two) times daily., Disp: , Rfl:    aspirin  EC 81 MG tablet, Take 1 tablet (81 mg total) by mouth daily., Disp: , Rfl:    atorvastatin  (LIPITOR ) 80 MG tablet, Take 1 tablet (80 mg total) by mouth daily at 6 PM., Disp: 30 tablet, Rfl: 2   carvedilol  (COREG ) 3.125 MG tablet, Take 1 tablet (3.125 mg total) by mouth 2 (two) times daily with a meal. (Patient not taking: Reported on 11/08/2023), Disp: 60 tablet, Rfl: 2   losartan (COZAAR) 25 MG tablet, Take 12.5 mg by mouth daily., Disp: , Rfl:    metoprolol tartrate (LOPRESSOR) 25 MG tablet, Take 25 mg by mouth 2 (two) times daily., Disp: , Rfl:    Multiple Vitamin (MULTIVITAMIN ADULT PO), Take 1 tablet by mouth daily., Disp: , Rfl:    nitroGLYCERIN  (NITROSTAT ) 0.4 MG SL tablet, Place 1 tablet (0.4 mg total) under the tongue every 5 (five) minutes x 3 doses as needed for chest pain., Disp: 25 tablet, Rfl: 1   pantoprazole  (PROTONIX ) 40 MG tablet, Take 40 mg by mouth daily., Disp: , Rfl:    sacubitril -valsartan  (ENTRESTO ) 24-26 MG, Take 1 tablet by mouth 2 (two) times daily., Disp: 60 tablet, Rfl: 1   spironolactone (ALDACTONE) 25 MG tablet, Take 12.5 mg by mouth daily., Disp: , Rfl:    ticagrelor  (BRILINTA ) 90 MG TABS tablet, Take 1 tablet (90 mg total) by mouth 2 (two) times daily. (Patient not taking: Reported on 11/08/2023), Disp: 60 tablet, Rfl:  2  Past Medical History: Past Medical History:  Diagnosis Date   Alcoholism (HCC)    CAD (coronary artery disease) 01/09/2018   Hx of MI in 2012 tx with DES to LAD, DES to RCA and DES to LCx at [facility] // Inf STEMI 5/19 >> LHC - dLM 50, pLAD 70, mLAD stent ok w/ 25 ISR, oLCx 35, mLCx stent ok, pRCA 95, mRCA 50, mRCA stent ok with 20 ISR, EF 25-35 >> PCI:  DES to prox RCA   Coronary artery disease    Xience V 2.5 x 15 mid LAD x 2, RCA Xience V 3.0 x 15 x 1, Xience V 3. x 23 mm x 1, 2012. High [facility]   GI bleed    Hyperlipidemia    Hypertension    Myocardial infarction [facility])    2012   Nephrolithiasis     Tobacco Use: Social History   Tobacco Use  Smoking Status Every Day   Current packs/day: 0.50   Average packs/day: 0.5 packs/day for 20.0 years (10.0 ttl pk-yrs)   Types: Cigarettes  Smokeless Tobacco Never    Labs: Review Flowsheet       Latest Ref Rng & Units 09/17/2017 09/18/2017 11/07/2017  Labs for ITP Cardiac and Pulmonary Rehab  Cholestrol 100 - 199 mg/dL 716  728  794   LDL (calc) 0 - 99 mg/dL 802  802  865  HDL-C >39 mg/dL 38  35  37   Trlycerides 0 - 149 mg/dL 760  805  831   TCO2 22 - 32 mmol/L 23  - -    Capillary Blood Glucose: Lab Results  Component Value Date   GLUCAP 103 (H) 05/03/2014   GLUCAP 103 (H) 05/02/2014   GLUCAP 76 05/02/2014   GLUCAP 119 (H) 05/01/2014   GLUCAP 74 05/01/2014     Exercise Target Goals: Exercise Program Goal: Individual exercise prescription set using results from initial 6 min walk test and THRR while considering  patient's activity barriers and safety.   Exercise Prescription Goal: Starting with aerobic activity 30 plus minutes a day, 3 days per week for initial exercise prescription. Provide home exercise prescription and guidelines that participant acknowledges understanding prior to discharge.  Activity Barriers & Risk Stratification:  Activity Barriers & Cardiac Risk Stratification - 11/08/23 1429        Activity Barriers & Cardiac Risk Stratification   Activity Barriers None    Cardiac Risk Stratification High          6 Minute Walk:  6 Minute Walk     Row Name 11/08/23 1538         6 Minute Walk   Phase Initial     Distance 1330 feet     Walk Time 6 minutes     # of Rest Breaks 0     MPH 2.52     METS 4.14     RPE 12     VO2 Peak 14.5     Symptoms No     Resting HR 75 bpm     Resting BP 118/68     Resting Oxygen Saturation  96 %     Exercise Oxygen Saturation  during 6 min walk 96 %     Max Ex. HR 81 bpm     Max Ex. BP 130/70     2 Minute Post BP 120/68        Oxygen Initial Assessment:   Oxygen Re-Evaluation:   Oxygen Discharge (Final Oxygen Re-Evaluation):   Initial Exercise Prescription:  Initial Exercise Prescription - 11/08/23 1500       Date of Initial Exercise RX and Referring Provider   Date 11/08/23    Referring Provider Burnie Loving MD      Treadmill   MPH 2    Grade 0.5    Minutes 15    METs 2.67      REL-XR   Level 2    Speed 50    Minutes 15    METs 1.9      Prescription Details   Frequency (times per week) 2    Duration Progress to 30 minutes of continuous aerobic without signs/symptoms of physical distress      Intensity   THRR 40-80% of Max Heartrate 112-148    Ratings of Perceived Exertion 11-13    Perceived Dyspnea 0-4      Resistance Training   Training Prescription Yes    Weight 4    Reps 10-15          Perform Capillary Blood Glucose checks as needed.  Exercise Prescription Changes:   Exercise Prescription Changes     Row Name 11/15/23 1300 12/04/23 1300           Response to Exercise   Blood Pressure (Admit) 124/62 110/70      Blood Pressure (Exercise) 150/70 130/70      Blood Pressure (Exit)  108/67 112/60      Heart Rate (Admit) 72 bpm 89 bpm      Heart Rate (Exercise) 92 bpm 113 bpm      Heart Rate (Exit) 81 bpm 76 bpm      Rating of Perceived Exertion (Exercise) 12 13      Duration  Continue with 30 min of aerobic exercise without signs/symptoms of physical distress. Continue with 30 min of aerobic exercise without signs/symptoms of physical distress.      Intensity THRR unchanged THRR unchanged        Progression   Progression Continue to progress workloads to maintain intensity without signs/symptoms of physical distress. Continue to progress workloads to maintain intensity without signs/symptoms of physical distress.        Resistance Training   Training Prescription Yes Yes      Weight 4 4      Reps 10-15 10-15        Treadmill   MPH 2 2.9      Grade 0.5 0      Minutes 15 15      METs 2.67 3.22        REL-XR   Level 2 5      Speed 50 52      Minutes 15 15      METs 4.1 4.1         Exercise Comments:   Exercise Comments     Row Name 11/15/23 1048           Exercise Comments First full day of exercise!  Patient was oriented to gym and equipment including functions, settings, policies, and procedures.  Patient's individual exercise prescription and treatment plan were reviewed.  All starting workloads were established based on the results of the 6 minute walk test done at initial orientation visit.  The plan for exercise progression was also introduced and progression will be customized based on patient's performance and goals.          Exercise Goals and Review:   Exercise Goals     Row Name 11/08/23 1540             Exercise Goals   Increase Physical Activity Yes       Intervention Provide advice, education, support and counseling about physical activity/exercise needs.;Develop an individualized exercise prescription for aerobic and resistive training based on initial evaluation findings, risk stratification, comorbidities and participant's personal goals.       Expected Outcomes Short Term: Attend rehab on a regular basis to increase amount of physical activity.;Long Term: Add in home exercise to make exercise part of routine and to  increase amount of physical activity.;Long Term: Exercising regularly at least 3-5 days a week.       Increase Strength and Stamina Yes       Intervention Provide advice, education, support and counseling about physical activity/exercise needs.;Develop an individualized exercise prescription for aerobic and resistive training based on initial evaluation findings, risk stratification, comorbidities and participant's personal goals.       Expected Outcomes Short Term: Increase workloads from initial exercise prescription for resistance, speed, and METs.;Short Term: Perform resistance training exercises routinely during rehab and add in resistance training at home;Long Term: Improve cardiorespiratory fitness, muscular endurance and strength as measured by increased METs and functional capacity ( )       Able to understand and use rate of perceived exertion (RPE) scale Yes       Intervention Provide education and explanation on  how to use RPE scale       Expected Outcomes Short Term: Able to use RPE daily in rehab to express subjective intensity level;Long Term:  Able to use RPE to guide intensity level when exercising independently       Able to understand and use Dyspnea scale Yes       Intervention Provide education and explanation on how to use Dyspnea scale       Expected Outcomes Short Term: Able to use Dyspnea scale daily in rehab to express subjective sense of shortness of breath during exertion;Long Term: Able to use Dyspnea scale to guide intensity level when exercising independently       Knowledge and understanding of Target Heart Rate Range (THRR) Yes       Intervention Provide education and explanation of THRR including how the numbers were predicted and where they are located for reference       Expected Outcomes Short Term: Able to state/look up THRR;Long Term: Able to use THRR to govern intensity when exercising independently;Short Term: Able to use daily as guideline for intensity in  rehab       Able to check pulse independently Yes       Intervention Provide education and demonstration on how to check pulse in carotid and radial arteries.;Review the importance of being able to check your own pulse for safety during independent exercise       Expected Outcomes Long Term: Able to check pulse independently and accurately;Short Term: Able to explain why pulse checking is important during independent exercise       Understanding of Exercise Prescription Yes       Intervention Provide education, explanation, and written materials on patient's individual exercise prescription       Expected Outcomes Short Term: Able to explain program exercise prescription;Long Term: Able to explain home exercise prescription to exercise independently          Exercise Goals Re-Evaluation :  Exercise Goals Re-Evaluation     Row Name 11/15/23 1048 12/11/23 1116           Exercise Goal Re-Evaluation   Exercise Goals Review Knowledge and understanding of Target Heart Rate Range (THRR);Able to understand and use rate of perceived exertion (RPE) scale Increase Strength and Stamina;Able to check pulse independently;Knowledge and understanding of Target Heart Rate Range (THRR);Understanding of Exercise Prescription;Increase Physical Activity      Comments Reviewed RPE and dyspnea scale, THR and program prescription with pt today.  Pt voiced understanding and was given a copy of goals to take home. Reviewed home exercise with pt today.  Pt plans to walk at home and use weights for exercise.  Reviewed THR, pulse, RPE, sign and symptoms, pulse oximetery and when to call 911 or MD.  Also discussed weather considerations and indoor options.  Pt voiced understanding.      Expected Outcomes Short: Use RPE daily to regulate intensity.  Long: Follow program prescription in THR. Short: Start to add in exercise at home Long: Conitnue to exercise independently          Discharge Exercise Prescription (Final  Exercise Prescription Changes):  Exercise Prescription Changes - 12/04/23 1300       Response to Exercise   Blood Pressure (Admit) 110/70    Blood Pressure (Exercise) 130/70    Blood Pressure (Exit) 112/60    Heart Rate (Admit) 89 bpm    Heart Rate (Exercise) 113 bpm    Heart Rate (Exit) 76 bpm  Rating of Perceived Exertion (Exercise) 13    Duration Continue with 30 min of aerobic exercise without signs/symptoms of physical distress.    Intensity THRR unchanged      Progression   Progression Continue to progress workloads to maintain intensity without signs/symptoms of physical distress.      Resistance Training   Training Prescription Yes    Weight 4    Reps 10-15      Treadmill   MPH 2.9    Grade 0    Minutes 15    METs 3.22      REL-XR   Level 5    Speed 52    Minutes 15    METs 4.1          Nutrition:  Target Goals: Understanding of nutrition guidelines, daily intake of sodium 1500mg , cholesterol 200mg , calories 30% from fat and 7% or less from saturated fats, daily to have 5 or more servings of fruits and vegetables.  Biometrics:  Pre Biometrics - 11/08/23 1541       Pre Biometrics   Height 5' 8 (1.727 m)    Weight 60.2 kg    Waist Circumference 32 inches    Hip Circumference 35 inches    Waist to Hip Ratio 0.91 %    BMI (Calculated) 20.18    Grip Strength 40.8 kg           Nutrition Therapy Plan and Nutrition Goals:   Nutrition Assessments:  MEDIFICTS Score Key: >=70 Need to make dietary changes  40-70 Heart Healthy Diet <= 40 Therapeutic Level Cholesterol Diet  Flowsheet Row CARDIAC REHAB PHASE II ORIENTATION from 11/08/2023 in [facility] CARDIAC REHABILITATION  Picture Your Plate Total Score on Admission 58   Picture Your Plate Scores: <59 Unhealthy dietary pattern with much room for improvement. 41-50 Dietary pattern unlikely to meet recommendations for good health and room for improvement. 51-60 More healthful dietary pattern,  with some room for improvement.  >60 Healthy dietary pattern, although there may be some specific behaviors that could be improved.    Nutrition Goals Re-Evaluation:  Nutrition Goals Re-Evaluation     Row Name 12/11/23 1127             Goals   Nutrition Goal Heart healthy diet       Comment Lealand is doing well in rehab.  He is trying to eat better and focus on healthy eating.  One of his roommates made lasangna last night and enjoyed it.  He is staying away from salt but does have some sweets more frequently.  He is snacking more frequently and we talked about reaching for healthy snacks. He misses the salt.       Expected Outcome Short: Aim for healthy snacks Long: Continue to work on diet.          Nutrition Goals Discharge (Final Nutrition Goals Re-Evaluation):  Nutrition Goals Re-Evaluation - 12/11/23 1127       Goals   Nutrition Goal Heart healthy diet    Comment Daiden is doing well in rehab.  He is trying to eat better and focus on healthy eating.  One of his roommates made lasangna last night and enjoyed it.  He is staying away from salt but does have some sweets more frequently.  He is snacking more frequently and we talked about reaching for healthy snacks. He misses the salt.    Expected Outcome Short: Aim for healthy snacks Long: Continue to work on diet.  Psychosocial: Target Goals: Acknowledge presence or absence of significant depression and/or stress, maximize coping skills, provide positive support system. Participant is able to verbalize types and ability to use techniques and skills needed for reducing stress and depression.  Initial Review & Psychosocial Screening:  Initial Psych Review & Screening - 11/08/23 1515       Initial Review   Current issues with Current Anxiety/Panic;Current Sleep Concerns      Family Dynamics   Good Support System? Yes      Barriers   Psychosocial barriers to participate in program The patient should benefit from  training in stress management and relaxation.      Screening Interventions   Interventions Encouraged to exercise;Provide feedback about the scores to participant    Expected Outcomes Short Term goal: Utilizing psychosocial counselor, staff and physician to assist with identification of specific Stressors or current issues interfering with healing process. Setting desired goal for each stressor or current issue identified.;Long Term Goal: Stressors or current issues are controlled or eliminated.;Short Term goal: Identification and review with participant of any Quality of Life or Depression concerns found by scoring the questionnaire.;Long Term goal: The participant improves quality of Life and PHQ9 Scores as seen by post scores and/or verbalization of changes          Quality of Life Scores:  Quality of Life - 11/08/23 1541       Quality of Life   Select Quality of Life      Quality of Life Scores   Health/Function Pre 16.23 %    Socioeconomic Pre 13.64 %    Psych/Spiritual Pre 17.07 %    Family Pre 12.5 %    GLOBAL Pre 15.5 %         Scores of 19 and below usually indicate a poorer quality of life in these areas.  A difference of  2-3 points is a clinically meaningful difference.  A difference of 2-3 points in the total score of the Quality of Life Index has been associated with significant improvement in overall quality of life, self-image, physical symptoms, and general health in studies assessing change in quality of life.  PHQ-9: Review Flowsheet       12/11/2023 11/08/2023  Depression screen PHQ 2/9  Decreased Interest 1 2  Down, Depressed, Hopeless 0 1  PHQ - 2 Score 1 3  Altered sleeping 0 1  Tired, decreased energy 1 1  Change in appetite 0 1  Feeling bad or failure about yourself  0 1  Trouble concentrating 1 -  Moving slowly or fidgety/restless 1 2  Suicidal thoughts 0 0  PHQ-9 Score 4 9  Difficult doing work/chores Somewhat difficult Somewhat difficult    Interpretation of Total Score  Total Score Depression Severity:  1-4 = Minimal depression, 5-9 = Mild depression, 10-14 = Moderate depression, 15-19 = Moderately severe depression, 20-27 = Severe depression   Psychosocial Evaluation and Intervention:  Psychosocial Evaluation - 11/08/23 1515       Psychosocial Evaluation & Interventions   Interventions Encouraged to exercise with the program and follow exercise prescription;Relaxation education;Stress management education    Comments Patient was referred to CR with CABGx3 and NSTEMI from [facility]. He says he has had a history of multiple MI's with stents. His initial PHQ-9 score was 9. He says he has a history of anxiety which has been treated in the past with therapy and antidepressants. He stopped the medication due to decreased libido. He denies depression but says  his girlfriend says he is depressed. He has not worked for several years due to burn out. He moved in with his girlfriend after his surgery. She is his only support person. He says he is participating in the program because his doctor recommended it. His stated goal was to get healthier and improve his energy. Lack of motivation could be a barrier for him to complete the program.    Expected Outcomes Short Term: Patient will start the program and attend consistently. Long Term: Patient will complete the program meeting personal goals.    Continue Psychosocial Services  Follow up required by staff          Psychosocial Re-Evaluation:  Psychosocial Re-Evaluation     Row Name 12/11/23 1120             Psychosocial Re-Evaluation   Current issues with Current Stress Concerns       Comments Landynn has just moved to [place].  He is now in a second floor apartment.  Other than the move, he denies any other major stressors.  He usually sleeps pretty well and has some days where he feels like he wants to sleep more.  His PHQ score has improved from 9 down to 4.  He also  has a PCP visit coming up. He has tried zoloft before and it did not work, but currently not on anything.  He is also planning to go back to work and trying to find a job soon.       Expected Outcomes Short: Talk to doctor about symptoms and if need to renew Long: Get transferred       Interventions Stress management education;Encouraged to attend Cardiac Rehabilitation for the exercise       Continue Psychosocial Services  Follow up required by staff          Psychosocial Discharge (Final Psychosocial Re-Evaluation):  Psychosocial Re-Evaluation - 12/11/23 1120       Psychosocial Re-Evaluation   Current issues with Current Stress Concerns    Comments Shanna has just moved to [place].  He is now in a second floor apartment.  Other than the move, he denies any other major stressors.  He usually sleeps pretty well and has some days where he feels like he wants to sleep more.  His PHQ score has improved from 9 down to 4.  He also has a PCP visit coming up. He has tried zoloft before and it did not work, but currently not on anything.  He is also planning to go back to work and trying to find a job soon.    Expected Outcomes Short: Talk to doctor about symptoms and if need to renew Long: Get transferred    Interventions Stress management education;Encouraged to attend Cardiac Rehabilitation for the exercise    Continue Psychosocial Services  Follow up required by staff          Vocational Rehabilitation: Provide vocational rehab assistance to qualifying candidates.   Vocational Rehab Evaluation & Intervention:  Vocational Rehab - 11/08/23 1514       Initial Vocational Rehab Evaluation & Intervention   Assessment shows need for Vocational Rehabilitation No      Vocational Rehab Re-Evaulation   Comments Patient says he has not worked for several years due to burn out. He currently has no income.          Education: Education Goals: Education classes will be provided on a weekly  basis, covering required topics. Participant  will state understanding/return demonstration of topics presented.  Learning Barriers/Preferences:  Learning Barriers/Preferences - 11/08/23 1513       Learning Barriers/Preferences   Learning Barriers None    Learning Preferences Written Material;Skilled Demonstration          Education Topics: Hypertension, Hypertension Reduction -Define heart disease and high blood pressure. Discus how high blood pressure affects the body and ways to reduce high blood pressure. Flowsheet Row CARDIAC REHAB PHASE II EXERCISE from 12/13/2023 in [place] CARDIAC REHABILITATION  Date 12/06/23  Educator jh  Instruction Review Code 1- Verbalizes Understanding    Exercise and Your Heart -Discuss why it is important to exercise, the FITT principles of exercise, normal and abnormal responses to exercise, and how to exercise safely.   Angina -Discuss definition of angina, causes of angina, treatment of angina, and how to decrease risk of having angina.   Cardiac Medications -Review what the following cardiac medications are used for, how they affect the body, and side effects that may occur when taking the medications.  Medications include Aspirin , Beta blockers, calcium  channel blockers, ACE Inhibitors, angiotensin receptor blockers, diuretics, digoxin, and antihyperlipidemics. Flowsheet Row CARDIAC REHAB PHASE II EXERCISE from 12/13/2023 in [place] CARDIAC REH[facility]ILITATION  Date 12/13/23  Educator [facility]  Instruction Review Code 1- Verbalizes Understanding    Congestive Heart Failure -Discuss the definition of CHF, how to live with CHF, the signs and symptoms of CHF, and how keep track of weight and sodium intake. Flowsheet Row CARDIAC REHAB PHASE II EXERCISE from 12/13/2023 in [place] CARDIAC REHABILITATION  Date 12/06/23  Educator jh  Instruction Review Code 1- Verbalizes Understanding    Heart Disease and Intimacy -Discus the effect sexual  activity has on the heart, how changes occur during intimacy as we age, and safety during sexual activity. Flowsheet Row CARDIAC REHAB PHASE II EXERCISE from 12/13/2023 in [place] CARDIAC REH[facility]ILITATION  Date 11/15/23  Educator jh  Instruction Review Code 1- Verbalizes Understanding    Smoking Cessation / COPD -Discuss different methods to quit smoking, the health benefits of quitting smoking, and the definition of COPD.   Nutrition I: Fats -Discuss the types of cholesterol, what cholesterol does to the heart, and how cholesterol levels can be controlled.   Nutrition II: Labels -Discuss the different components of food labels and how to read food label   Heart Parts/Heart Disease and PAD -Discuss the anatomy of the heart, the pathway of blood circulation through the heart, and these are affected by heart disease.   Stress I: Signs and Symptoms -Discuss the causes of stress, how stress may lead to anxiety and depression, and ways to limit stress.   Stress II: Relaxation -Discuss different types of relaxation techniques to limit stress. Flowsheet Row CARDIAC REHAB PHASE II EXERCISE from 12/13/2023 in [facility]  Date 11/29/23  Educator jh  Instruction Review Code 1- Verbalizes Understanding    Warning Signs of Stroke / TIA -Discuss definition of a stroke, what the signs and symptoms are of a stroke, and how to identify when someone is having stroke.   Knowledge Questionnaire Score:  Knowledge Questionnaire Score - 11/08/23 1443       Knowledge Questionnaire Score   Pre Score 25/28          Core Components/Risk Factors/Patient Goals at Admission:  Personal Goals and Risk Factors at Admission - 11/08/23 1515       Core Components/Risk Factors/Patient Goals on Admission    Weight Management Weight  Maintenance    Heart Failure Yes    Intervention Provide a combined exercise and nutrition program that is supplemented with education, support  and counseling about heart failure. Directed toward relieving symptoms such as shortness of breath, decreased exercise tolerance, and extremity edema.    Expected Outcomes Improve functional capacity of life;Short term: Attendance in program 2-3 days a week with increased exercise capacity. Reported lower sodium intake. Reported increased fruit and vegetable intake. Reports medication compliance.;Short term: Daily weights obtained and reported for increase. Utilizing diuretic protocols set by physician.;Long term: Adoption of self-care skills and reduction of barriers for early signs and symptoms recognition and intervention leading to self-care maintenance.    Hypertension Yes    Intervention Monitor prescription use compliance.;Provide education on lifestyle modifcations including regular physical activity/exercise, weight management, moderate sodium restriction and increased consumption of fresh fruit, vegetables, and low fat dairy, alcohol moderation, and smoking cessation.    Expected Outcomes Short Term: Continued assessment and intervention until BP is < 140/27mm HG in hypertensive participants. < 130/3mm HG in hypertensive participants with diabetes, heart failure or chronic kidney disease.;Long Term: Maintenance of blood pressure at goal levels.    Lipids Yes    Intervention Provide education and support for participant on nutrition & aerobic/resistive exercise along with prescribed medications to achieve LDL 70mg , HDL >40mg .    Expected Outcomes Long Term: Cholesterol controlled with medications as prescribed, with individualized exercise RX and with personalized nutrition plan. Value goals: LDL < 70mg , HDL > 40 mg.;Short Term: Participant states understanding of desired cholesterol values and is compliant with medications prescribed. Participant is following exercise prescription and nutrition guidelines.          Core Components/Risk Factors/Patient Goals Review:   Goals and Risk Factor  Review     Row Name 12/11/23 1130             Core Components/Risk Factors/Patient Goals Review   Personal Goals Review Weight Management/Obesity;Hypertension;Lipids;Heart Failure       Review [place] is doing well in rehab.  His weight is staying steady. His pressures are doing well in class.  He has not been checking them at home as much now that he has moved. He plans to stop by [facility] to check while out for his walk.  He is watching his sodium and denies any symptoms.       Expected Outcomes Short: Continue to keep eye on blood pressure Long: Continue to montior risk factors.          Core Components/Risk Factors/Patient Goals at Discharge (Final Review):   Goals and Risk Factor Review - 12/11/23 1130       Core Components/Risk Factors/Patient Goals Review   Personal Goals Review Weight Management/Obesity;Hypertension;Lipids;Heart Failure    Review Ariq is doing well in rehab.  His weight is staying steady. His pressures are doing well in class.  He has not been checking them at home as much now that he has moved. He plans to stop by [facility] to check while out for his walk.  He is watching his sodium and denies any symptoms.    Expected Outcomes Short: Continue to keep eye on blood pressure Long: Continue to montior risk factors.          ITP Comments:  ITP Comments     Row Name 11/15/23 1047 11/22/23 1153 11/29/23 0851 12/11/23 1117     ITP Comments First full day of exercise!  Patient was oriented to gym and equipment including functions,  settings, policies, and procedures.  Patient's individual exercise prescription and treatment plan were reviewed.  All starting workloads were established based on the results of the 6 minute walk test done at initial orientation visit.  The plan for exercise progression was also introduced and progression will be customized based on patient's performance and goals. Called regarding missed CR session today. He said he had some GI issues  this morning as was not able to attend. He may come Friday to make up the session, otherwise, plans to return Monday. 30 day review completed. ITP sent to Dr. Dorn Ross, Medical Director of Cardiac Rehab. Continue with ITP unless changes are made by physician. New to program. Lyfe has moved to [facility] and would like transfer his cardiac rehab to [facility].  We will reach out to them to transfer his care.       Comments: Discharge ITP. Patient is moving and will be going to [facility] rehab.

## 2023-12-19 NOTE — Progress Notes (Signed)
 Discharge Progress Report  Patient Details  Name: Bradley Compton MRN: [id number] Date of Birth: [birth year] Referring Provider:   Flowsheet Row CARDIAC REHAB PHASE II ORIENTATION from 11/08/2023 in [facility]  Referring Provider Burnie Loving MD     Number of Visits: 13   Reason for Discharge:  Early Exit:  Personal  Smoking History:  Social History   Tobacco Use  Smoking Status Every Day   Current packs/day: 0.50   Average packs/day: 0.5 packs/day for 20.0 years (10.0 ttl pk-yrs)   Types: Cigarettes  Smokeless Tobacco Never    Diagnosis:  No diagnosis found.  ADL UCSD:   Initial Exercise Prescription:  Initial Exercise Prescription - 11/08/23 1500       Date of Initial Exercise RX and Referring Provider   Date 11/08/23    Referring Provider Burnie Loving MD      Treadmill   MPH 2    Grade 0.5    Minutes 15    METs 2.67      REL-XR   Level 2    Speed 50    Minutes 15    METs 1.9      Prescription Details   Frequency (times per week) 2    Duration Progress to 30 minutes of continuous aerobic without signs/symptoms of physical distress      Intensity   THRR 40-80% of Max Heartrate 112-148    Ratings of Perceived Exertion 11-13    Perceived Dyspnea 0-4      Resistance Training   Training Prescription Yes    Weight 4    Reps 10-15          Discharge Exercise Prescription (Final Exercise Prescription Changes):  Exercise Prescription Changes - 12/04/23 1300       Response to Exercise   Blood Pressure (Admit) 110/70    Blood Pressure (Exercise) 130/70    Blood Pressure (Exit) 112/60    Heart Rate (Admit) 89 bpm    Heart Rate (Exercise) 113 bpm    Heart Rate (Exit) 76 bpm    Rating of Perceived Exertion (Exercise) 13    Duration Continue with 30 min of aerobic exercise without signs/symptoms of physical distress.    Intensity THRR unchanged      Progression   Progression Continue to progress workloads to  maintain intensity without signs/symptoms of physical distress.      Resistance Training   Training Prescription Yes    Weight 4    Reps 10-15      Treadmill   MPH 2.9    Grade 0    Minutes 15    METs 3.22      REL-XR   Level 5    Speed 52    Minutes 15    METs 4.1          Functional Capacity:  6 Minute Walk     Row Name 11/08/23 1538         6 Minute Walk   Phase Initial     Distance 1330 feet     Walk Time 6 minutes     # of Rest Breaks 0     MPH 2.52     METS 4.14     RPE 12     VO2 Peak 14.5     Symptoms No     Resting HR 75 bpm     Resting BP 118/68     Resting Oxygen Saturation  96 %  Exercise Oxygen Saturation  during 6 min walk 96 %     Max Ex. HR 81 bpm     Max Ex. BP 130/70     2 Minute Post BP 120/68        Psychological, QOL, Others - Outcomes: PHQ 2/9:    12/11/2023   11:24 AM 11/08/2023    3:11 PM  Depression screen PHQ 2/9  Decreased Interest 1 2  Down, Depressed, Hopeless 0 1  PHQ - 2 Score 1 3  Altered sleeping 0 1  Tired, decreased energy 1 1  Change in appetite 0 1  Feeling bad or failure about yourself  0 1  Trouble concentrating 1   Moving slowly or fidgety/restless 1 2  Suicidal thoughts 0 0  PHQ-9 Score 4 9  Difficult doing work/chores Somewhat difficult Somewhat difficult    Quality of Life:  Quality of Life - 11/08/23 1541       Quality of Life   Select Quality of Life      Quality of Life Scores   Health/Function Pre 16.23 %    Socioeconomic Pre 13.64 %    Psych/Spiritual Pre 17.07 %    Family Pre 12.5 %    GLOBAL Pre 15.5 %          Personal Goals: Goals established at orientation with interventions provided to work toward goal.  Personal Goals and Risk Factors at Admission - 11/08/23 1515       Core Components/Risk Factors/Patient Goals on Admission    Weight Management Weight Maintenance    Heart Failure Yes    Intervention Provide a combined exercise and nutrition program that is  supplemented with education, support and counseling about heart failure. Directed toward relieving symptoms such as shortness of breath, decreased exercise tolerance, and extremity edema.    Expected Outcomes Improve functional capacity of life;Short term: Attendance in program 2-3 days a week with increased exercise capacity. Reported lower sodium intake. Reported increased fruit and vegetable intake. Reports medication compliance.;Short term: Daily weights obtained and reported for increase. Utilizing diuretic protocols set by physician.;Long term: Adoption of self-care skills and reduction of barriers for early signs and symptoms recognition and intervention leading to self-care maintenance.    Hypertension Yes    Intervention Monitor prescription use compliance.;Provide education on lifestyle modifcations including regular physical activity/exercise, weight management, moderate sodium restriction and increased consumption of fresh fruit, vegetables, and low fat dairy, alcohol moderation, and smoking cessation.    Expected Outcomes Short Term: Continued assessment and intervention until BP is < 140/45mm HG in hypertensive participants. < 130/41mm HG in hypertensive participants with diabetes, heart failure or chronic kidney disease.;Long Term: Maintenance of blood pressure at goal levels.    Lipids Yes    Intervention Provide education and support for participant on nutrition & aerobic/resistive exercise along with prescribed medications to achieve LDL 70mg , HDL >40mg .    Expected Outcomes Long Term: Cholesterol controlled with medications as prescribed, with individualized exercise RX and with personalized nutrition plan. Value goals: LDL < 70mg , HDL > 40 mg.;Short Term: Participant states understanding of desired cholesterol values and is compliant with medications prescribed. Participant is following exercise prescription and nutrition guidelines.           Personal Goals Discharge:  Goals and  Risk Factor Review     Row Name 12/11/23 1130             Core Components/Risk Factors/Patient Goals Review   Personal Goals Review Weight Management/Obesity;Hypertension;Lipids;Heart  Failure       Review Bradley Compton is doing well in rehab.  His weight is staying steady. His pressures are doing well in class.  He has not been checking them at home as much now that he has moved. He plans to stop by [facility] to check while out for his walk.  He is watching his sodium and denies any symptoms.       Expected Outcomes Short: Continue to keep eye on blood pressure Long: Continue to montior risk factors.          Exercise Goals and Review:  Exercise Goals     Row Name 11/08/23 1540             Exercise Goals   Increase Physical Activity Yes       Intervention Provide advice, education, support and counseling about physical activity/exercise needs.;Develop an individualized exercise prescription for aerobic and resistive training based on initial evaluation findings, risk stratification, comorbidities and participant's personal goals.       Expected Outcomes Short Term: Attend rehab on a regular basis to increase amount of physical activity.;Long Term: Add in home exercise to make exercise part of routine and to increase amount of physical activity.;Long Term: Exercising regularly at least 3-5 days a week.       Increase Strength and Stamina Yes       Intervention Provide advice, education, support and counseling about physical activity/exercise needs.;Develop an individualized exercise prescription for aerobic and resistive training based on initial evaluation findings, risk stratification, comorbidities and participant's personal goals.       Expected Outcomes Short Term: Increase workloads from initial exercise prescription for resistance, speed, and METs.;Short Term: Perform resistance training exercises routinely during rehab and add in resistance training at home;Long Term: Improve  cardiorespiratory fitness, muscular endurance and strength as measured by increased METs and functional capacity ( )       Able to understand and use rate of perceived exertion (RPE) scale Yes       Intervention Provide education and explanation on how to use RPE scale       Expected Outcomes Short Term: Able to use RPE daily in rehab to express subjective intensity level;Long Term:  Able to use RPE to guide intensity level when exercising independently       Able to understand and use Dyspnea scale Yes       Intervention Provide education and explanation on how to use Dyspnea scale       Expected Outcomes Short Term: Able to use Dyspnea scale daily in rehab to express subjective sense of shortness of breath during exertion;Long Term: Able to use Dyspnea scale to guide intensity level when exercising independently       Knowledge and understanding of Target Heart Rate Range (THRR) Yes       Intervention Provide education and explanation of THRR including how the numbers were predicted and where they are located for reference       Expected Outcomes Short Term: Able to state/look up THRR;Long Term: Able to use THRR to govern intensity when exercising independently;Short Term: Able to use daily as guideline for intensity in rehab       Able to check pulse independently Yes       Intervention Provide education and demonstration on how to check pulse in carotid and radial arteries.;Review the importance of being able to check your own pulse for safety during independent exercise       Expected Outcomes Long  Term: Able to check pulse independently and accurately;Short Term: Able to explain why pulse checking is important during independent exercise       Understanding of Exercise Prescription Yes       Intervention Provide education, explanation, and written materials on patient's individual exercise prescription       Expected Outcomes Short Term: Able to explain program exercise prescription;Long  Term: Able to explain home exercise prescription to exercise independently          Exercise Goals Re-Evaluation:  Exercise Goals Re-Evaluation     Row Name 11/15/23 1048 12/11/23 1116           Exercise Goal Re-Evaluation   Exercise Goals Review Knowledge and understanding of Target Heart Rate Range (THRR);Able to understand and use rate of perceived exertion (RPE) scale Increase Strength and Stamina;Able to check pulse independently;Knowledge and understanding of Target Heart Rate Range (THRR);Understanding of Exercise Prescription;Increase Physical Activity      Comments Reviewed RPE and dyspnea scale, THR and program prescription with pt today.  Pt voiced understanding and was given a copy of goals to take home. Reviewed home exercise with pt today.  Pt plans to walk at home and use weights for exercise.  Reviewed THR, pulse, RPE, sign and symptoms, pulse oximetery and when to call 911 or MD.  Also discussed weather considerations and indoor options.  Pt voiced understanding.      Expected Outcomes Short: Use RPE daily to regulate intensity.  Long: Follow program prescription in THR. Short: Start to add in exercise at home Long: Conitnue to exercise independently         Nutrition & Weight - Outcomes:  Pre Biometrics - 11/08/23 1541       Pre Biometrics   Height 5' 8 (1.727 m)    Weight 60.2 kg    Waist Circumference 32 inches    Hip Circumference 35 inches    Waist to Hip Ratio 0.91 %    BMI (Calculated) 20.18    Grip Strength 40.8 kg           Nutrition:   Nutrition Discharge:   Education Questionnaire Score:  Knowledge Questionnaire Score - 11/08/23 1443       Knowledge Questionnaire Score   Pre Score 25/28          Goals reviewed with patient; copy given to patient.

## 2023-12-20 ENCOUNTER — Encounter (HOSPITAL_COMMUNITY): Payer: MEDICAID

## 2023-12-25 ENCOUNTER — Encounter (HOSPITAL_COMMUNITY): Payer: MEDICAID

## 2023-12-27 ENCOUNTER — Encounter (HOSPITAL_COMMUNITY): Payer: MEDICAID

## 2024-01-01 ENCOUNTER — Encounter (HOSPITAL_COMMUNITY): Payer: MEDICAID

## 2024-01-03 ENCOUNTER — Encounter (HOSPITAL_COMMUNITY): Payer: MEDICAID

## 2024-01-08 ENCOUNTER — Encounter (HOSPITAL_COMMUNITY): Payer: MEDICAID

## 2024-01-10 ENCOUNTER — Encounter (HOSPITAL_COMMUNITY): Payer: MEDICAID

## 2024-01-17 ENCOUNTER — Encounter (HOSPITAL_COMMUNITY): Payer: MEDICAID

## 2024-01-22 ENCOUNTER — Encounter (HOSPITAL_COMMUNITY): Payer: MEDICAID

## 2024-01-24 ENCOUNTER — Encounter (HOSPITAL_COMMUNITY): Payer: MEDICAID

## 2024-01-29 ENCOUNTER — Encounter (HOSPITAL_COMMUNITY): Payer: MEDICAID

## 2024-01-31 ENCOUNTER — Encounter (HOSPITAL_COMMUNITY): Payer: MEDICAID

## 2024-02-05 ENCOUNTER — Encounter (HOSPITAL_COMMUNITY): Payer: MEDICAID

## 2024-02-07 ENCOUNTER — Encounter (HOSPITAL_COMMUNITY): Payer: MEDICAID

## 2024-02-12 ENCOUNTER — Encounter (HOSPITAL_COMMUNITY): Payer: MEDICAID

## 2024-02-14 ENCOUNTER — Encounter (HOSPITAL_COMMUNITY): Payer: MEDICAID

## 2024-02-19 ENCOUNTER — Encounter (HOSPITAL_COMMUNITY): Payer: MEDICAID

## 2024-02-21 ENCOUNTER — Encounter (HOSPITAL_COMMUNITY): Payer: MEDICAID

## 2024-02-26 ENCOUNTER — Encounter (HOSPITAL_COMMUNITY): Payer: MEDICAID

## 2024-02-28 ENCOUNTER — Encounter (HOSPITAL_COMMUNITY): Payer: MEDICAID

## 2024-03-04 ENCOUNTER — Encounter (HOSPITAL_COMMUNITY): Payer: MEDICAID

## 2024-03-06 ENCOUNTER — Encounter (HOSPITAL_COMMUNITY): Payer: MEDICAID

## 2024-03-11 ENCOUNTER — Encounter (HOSPITAL_COMMUNITY): Payer: MEDICAID
